# Patient Record
Sex: Female | Born: 1955
Health system: Southern US, Community
[De-identification: ages and names within clinical notes are randomized; demographics above are authoritative.]

## PROBLEM LIST (undated history)

## (undated) DIAGNOSIS — E669 Obesity, unspecified: Secondary | ICD-10-CM

## (undated) DIAGNOSIS — N952 Postmenopausal atrophic vaginitis: Secondary | ICD-10-CM

## (undated) DIAGNOSIS — D509 Iron deficiency anemia, unspecified: Secondary | ICD-10-CM

## (undated) DIAGNOSIS — I10 Essential (primary) hypertension: Secondary | ICD-10-CM

## (undated) DIAGNOSIS — K589 Irritable bowel syndrome without diarrhea: Secondary | ICD-10-CM

## (undated) DIAGNOSIS — G4733 Obstructive sleep apnea (adult) (pediatric): Secondary | ICD-10-CM

## (undated) HISTORY — DX: Irritable bowel syndrome, unspecified: K58.9

## (undated) HISTORY — PX: FRACTURE SURGERY: SHX138

## (undated) HISTORY — DX: Iron deficiency anemia, unspecified: D50.9

## (undated) HISTORY — DX: Postmenopausal atrophic vaginitis: N95.2

## (undated) HISTORY — DX: Obstructive sleep apnea (adult) (pediatric): G47.33

## (undated) HISTORY — PX: LAPAROSCOPY: SHX197

## (undated) HISTORY — DX: Obesity, unspecified: E66.9

---

## 2000-06-26 ENCOUNTER — Encounter: Admission: RE | Admit: 2000-06-26 | Discharge: 2000-06-26 | Payer: Self-pay | Admitting: Family Medicine

## 2000-06-26 ENCOUNTER — Encounter: Payer: Self-pay | Admitting: Family Medicine

## 2001-07-12 ENCOUNTER — Encounter: Admission: RE | Admit: 2001-07-12 | Discharge: 2001-07-12 | Payer: Self-pay | Admitting: Family Medicine

## 2001-07-12 ENCOUNTER — Encounter: Payer: Self-pay | Admitting: Family Medicine

## 2001-07-16 ENCOUNTER — Other Ambulatory Visit: Admission: RE | Admit: 2001-07-16 | Discharge: 2001-07-16 | Payer: Self-pay | Admitting: Family Medicine

## 2002-07-22 ENCOUNTER — Other Ambulatory Visit: Admission: RE | Admit: 2002-07-22 | Discharge: 2002-07-22 | Payer: Self-pay | Admitting: Family Medicine

## 2002-08-17 ENCOUNTER — Encounter: Payer: Self-pay | Admitting: Family Medicine

## 2002-08-17 ENCOUNTER — Encounter: Admission: RE | Admit: 2002-08-17 | Discharge: 2002-08-17 | Payer: Self-pay | Admitting: Family Medicine

## 2002-08-24 ENCOUNTER — Encounter: Payer: Self-pay | Admitting: Family Medicine

## 2002-08-24 ENCOUNTER — Encounter: Admission: RE | Admit: 2002-08-24 | Discharge: 2002-08-24 | Payer: Self-pay | Admitting: Family Medicine

## 2003-01-06 ENCOUNTER — Encounter: Admission: RE | Admit: 2003-01-06 | Discharge: 2003-01-06 | Payer: Self-pay | Admitting: Family Medicine

## 2003-01-06 ENCOUNTER — Encounter: Payer: Self-pay | Admitting: Family Medicine

## 2003-08-17 ENCOUNTER — Other Ambulatory Visit: Admission: RE | Admit: 2003-08-17 | Discharge: 2003-08-17 | Payer: Self-pay | Admitting: Family Medicine

## 2004-08-28 ENCOUNTER — Other Ambulatory Visit: Admission: RE | Admit: 2004-08-28 | Discharge: 2004-08-28 | Payer: Self-pay | Admitting: Family Medicine

## 2005-03-04 ENCOUNTER — Encounter: Admission: RE | Admit: 2005-03-04 | Discharge: 2005-03-04 | Payer: Self-pay | Admitting: Family Medicine

## 2005-09-04 ENCOUNTER — Other Ambulatory Visit: Admission: RE | Admit: 2005-09-04 | Discharge: 2005-09-04 | Payer: Self-pay | Admitting: Family Medicine

## 2006-09-07 ENCOUNTER — Other Ambulatory Visit: Admission: RE | Admit: 2006-09-07 | Discharge: 2006-09-07 | Payer: Self-pay | Admitting: Family Medicine

## 2007-06-10 ENCOUNTER — Encounter: Admission: RE | Admit: 2007-06-10 | Discharge: 2007-06-10 | Payer: Self-pay | Admitting: Family Medicine

## 2007-06-18 ENCOUNTER — Encounter: Admission: RE | Admit: 2007-06-18 | Discharge: 2007-06-18 | Payer: Self-pay | Admitting: Family Medicine

## 2007-10-08 ENCOUNTER — Encounter: Admission: RE | Admit: 2007-10-08 | Discharge: 2007-10-08 | Payer: Self-pay | Admitting: Family Medicine

## 2007-10-18 ENCOUNTER — Other Ambulatory Visit: Admission: RE | Admit: 2007-10-18 | Discharge: 2007-10-18 | Payer: Self-pay | Admitting: Obstetrics and Gynecology

## 2007-12-30 ENCOUNTER — Ambulatory Visit (HOSPITAL_COMMUNITY): Admission: RE | Admit: 2007-12-30 | Discharge: 2007-12-30 | Payer: Self-pay | Admitting: Obstetrics and Gynecology

## 2007-12-30 ENCOUNTER — Encounter (INDEPENDENT_AMBULATORY_CARE_PROVIDER_SITE_OTHER): Payer: Self-pay | Admitting: Obstetrics and Gynecology

## 2008-07-23 ENCOUNTER — Inpatient Hospital Stay: Payer: Self-pay | Admitting: Specialist

## 2008-08-01 ENCOUNTER — Other Ambulatory Visit: Admission: RE | Admit: 2008-08-01 | Discharge: 2008-08-01 | Payer: Self-pay | Admitting: Family Medicine

## 2009-08-23 ENCOUNTER — Other Ambulatory Visit: Admission: RE | Admit: 2009-08-23 | Discharge: 2009-08-23 | Payer: Self-pay | Admitting: Family Medicine

## 2009-09-05 ENCOUNTER — Encounter: Admission: RE | Admit: 2009-09-05 | Discharge: 2009-09-05 | Payer: Self-pay | Admitting: Family Medicine

## 2009-09-10 ENCOUNTER — Encounter: Admission: RE | Admit: 2009-09-10 | Discharge: 2009-09-10 | Payer: Self-pay | Admitting: Family Medicine

## 2010-08-06 NOTE — H&P (Signed)
Dana [name], Dana [name]                ACCOUNT NO.:  [id number]   MEDICAL RECORD NO.:  [id number]          PATIENT TYPE:  AMB   LOCATION:  SDC                           FACILITY:  [facility]   PHYSICIAN:  Charles A. Delcambre, MDDATE OF BIRTH:  22-Feb-1956   DATE OF ADMISSION:  DATE OF DISCHARGE:                              HISTORY & PHYSICAL   This patient to be admitted to undergo hysteroscopy D&C for  postmenopausal bleeding with a posterior endometrial mass noted on  hysterosalpingogram on October 30, 2007, at 12 noon.  She gives informed  consent, accepts risks of infection, bleeding, uterine perforation,  bowel and bladder damage, blood product risk including hepatitis and HIV  exposure, fluid overload.  All questions were answered.  She gives  informed consent.   PAST MEDICAL HISTORY:  Hypertension.   SURGICAL HISTORY:  Laparoscopy for endometriosis in 1992.   MEDICATIONS:  Lisinopril/hydrochlorothiazide 1/30 once a day.   ALLERGIES:  No known drug allergies.   SOCIAL HISTORY:  Denies tobacco, ethanol, or drug use or STD exposure in  the past.  One partner, monogamous, married.   FAMILY HISTORY:  Lung cancer in her mother, breast cancer in her mother,  not specified.   REVIEW OF SYSTEMS:  She sees Dr. Arvilla Market for occasional numbness in  arms, headaches, shortness of breath, chest pain; however, all other  reviews of systems is negative.  She occasionally has hot flashes at  night.   PHYSICAL EXAMINATION:  GENERAL:  Alert and oriented x3.  VITAL SIGNS:  Blood pressure 118/70, pulse 68, respirations 16,  afebrile.  HEART:  Regular rate and rhythm.  LUNGS:  Clear bilaterally.  ABDOMEN:  Soft, flat, nontender.  No masses.  PELVIC:  Normal external female genitalia.  Bartholin and urethral  glands within normal limits.  Bulb without discharge or lesions.   Endometrial biopsy on November 04, 2007, was negative.  Pap October 18, 2007,  was negative.  Bimanual examination, uterus  nonenlarged.  Adnexa  nontender without masses.  Ovaries nonpalpable bilaterally.   ASSESSMENT:  1. Postmenopausal bleeding, 627.1.  2. Endometrial polyp, 621.0.   PLAN:  Hysteroscopy D&C as noted above.  Preop CBC.  We will go ahead  and do a serum pregnancy and that she is about 1 year out from  menopause, n.p.o. post midnight.  All questions were answered.  We will  proceed as outlined.      Charles A. Sydnee Cabal, MD  Electronically Signed     CAD/MEDQ  D:  12/22/2007  T:  12/23/2007  Job:  [id number]

## 2010-08-06 NOTE — Op Note (Signed)
[name], [name]                ACCOUNT NO.:  [id number]   MEDICAL RECORD NO.:  [id number]          PATIENT TYPE:  AMB   LOCATION:  SDC                           [facility]ACILITY:  [facility]   PHYSICIAN:  Charles A. Delcambre, MDDATE OF BIRTH:  02/10/1956   DATE OF PROCEDURE:  12/30/2007  DATE OF DISCHARGE:                               OPERATIVE REPORT   PREOPERATIVE DIAGNOSES:  1. Postmenopausal bleeding  2. Endometrial mass.   POSTOPERATIVE DIAGNOSES:  1. Postmenopausal bleeding  2. Endometrial mass.   PROCEDURES:  1. Hysteroscopy.  2. Dilation and curettage.  3. Polypectomy versus fibroid resection.  4. Paracervical block.   SURGEON:  Charles A. Delcambre, MD   ASSISTANT:  None.   COMPLICATIONS:  None.   ESTIMATED BLOOD LOSS:  Less than 10 mL.   SORBITOL LOSS:  Less than 50 mL.   FINDINGS:  Anterior mass looking like a sessile polyp, 1-2 cm, but felt  more like a fibroid with curettage.   SPECIMEN:  Endometrial curettings with polyp/fibroid.   ANESTHESIA:  Monitored anesthesia care with IV sedation.  Instrument,  sponge, and needle count correct x2.   DESCRIPTION OF PROCEDURE:  The patient was taken to the operating room  and placed in supine position.  Anesthesia was given.  She was placed in  dorsal lithotomy position.  Sterile prep and drape was undertaken.  Speculum was placed in vagina.  Weighted speculum was placed in the  vagina and the anterior lip cervix was grasped with a single-tooth  tenaculum.  Paracervical block with 20 mL total 0.25% plain Marcaine was  injected at 4 and 8 o'clock.  There was no evidence of intravascular  location of injection.  Sound was to 8 cm.  No evidence of perforation.  Dilation with Hanks dilators were used to dilate up enough to pass 5-mm  scope.  Scope was placed.  Findings were noted above.  Using a curette,  the anterior lesion was curetted off and visualization after curettage  did yield clear uterus with the entire mass  resected.  With curettage  pieces of the mass and any endometrium were sent  to pathology.  Sorbitol loss was less than 50 mL.  There was no evidence  of perforation.  Tenaculum was removed.  Hemostasis was excellent.  The  patient was awakened and taken to recovery with physician in attendance  having tolerated the procedure well.      Charles A. Sydnee Cabal, MD  Electronically Signed     CAD/MEDQ  D:  12/30/2007  T:  12/31/2007  Job:  [id number]

## 2010-08-23 ENCOUNTER — Other Ambulatory Visit: Payer: Self-pay | Admitting: Family Medicine

## 2010-08-23 DIAGNOSIS — Z1231 Encounter for screening mammogram for malignant neoplasm of breast: Secondary | ICD-10-CM

## 2010-09-12 ENCOUNTER — Ambulatory Visit
Admission: RE | Admit: 2010-09-12 | Discharge: 2010-09-12 | Disposition: A | Discharge status: Home / Self Care | Payer: BC Managed Care – PPO | Source: Ambulatory Visit | Attending: Family Medicine | Admitting: Family Medicine

## 2010-09-12 DIAGNOSIS — Z1231 Encounter for screening mammogram for malignant neoplasm of breast: Secondary | ICD-10-CM

## 2010-12-24 LAB — HCG, SERUM, QUALITATIVE: Preg, Serum: NEGATIVE

## 2010-12-24 LAB — CBC
HCT: 39.3
Platelets: 176
RBC: 4.59
RDW: 13.5

## 2010-12-24 LAB — BASIC METABOLIC PANEL: BUN: 14

## 2011-05-12 ENCOUNTER — Ambulatory Visit
Admission: RE | Admit: 2011-05-12 | Discharge: 2011-05-12 | Disposition: A | Discharge status: Home / Self Care | Payer: BC Managed Care – PPO | Source: Ambulatory Visit | Attending: Family Medicine | Admitting: Family Medicine

## 2011-05-12 ENCOUNTER — Other Ambulatory Visit: Payer: Self-pay | Admitting: Family Medicine

## 2011-05-12 DIAGNOSIS — R52 Pain, unspecified: Secondary | ICD-10-CM

## 2011-05-12 DIAGNOSIS — R200 Anesthesia of skin: Secondary | ICD-10-CM

## 2011-06-02 ENCOUNTER — Other Ambulatory Visit: Payer: Self-pay | Admitting: Neurosurgery

## 2011-06-02 DIAGNOSIS — M47812 Spondylosis without myelopathy or radiculopathy, cervical region: Secondary | ICD-10-CM

## 2011-06-03 ENCOUNTER — Other Ambulatory Visit: Payer: BC Managed Care – PPO

## 2011-07-31 ENCOUNTER — Emergency Department (HOSPITAL_COMMUNITY)
Admission: EM | Admit: 2011-07-31 | Discharge: 2011-07-31 | Disposition: A | Discharge status: Home / Self Care | Payer: BC Managed Care – PPO | Attending: Emergency Medicine | Admitting: Emergency Medicine

## 2011-07-31 ENCOUNTER — Encounter (HOSPITAL_COMMUNITY): Payer: Self-pay

## 2011-07-31 ENCOUNTER — Emergency Department (HOSPITAL_COMMUNITY): Payer: BC Managed Care – PPO

## 2011-07-31 DIAGNOSIS — R079 Chest pain, unspecified: Secondary | ICD-10-CM | POA: Insufficient documentation

## 2011-07-31 DIAGNOSIS — I10 Essential (primary) hypertension: Secondary | ICD-10-CM | POA: Insufficient documentation

## 2011-07-31 HISTORY — DX: Essential (primary) hypertension: I10

## 2011-07-31 LAB — CBC
MCH: 28.7 pg (ref 26.0–34.0)
MCV: 83.1 fL (ref 78.0–100.0)
RBC: 4.43 MIL/uL (ref 3.87–5.11)
RDW: 12.6 % (ref 11.5–15.5)

## 2011-07-31 LAB — POCT I-STAT TROPONIN I
Troponin i, poc: 0 ng/mL (ref 0.00–0.08)
Troponin i, poc: 0 ng/mL (ref 0.00–0.08)

## 2011-07-31 LAB — D-DIMER, QUANTITATIVE: D-Dimer, Quant: <0.22 ug/mL-FEU (ref 0.00–0.48)

## 2011-07-31 LAB — COMPREHENSIVE METABOLIC PANEL
CO2: 25 mEq/L (ref 19–32)
Calcium: 9.8 mg/dL (ref 8.4–10.5)
Creatinine, Ser: 0.62 mg/dL (ref 0.50–1.10)
Sodium: 139 mEq/L (ref 135–145)
Total Bilirubin: 0.5 mg/dL (ref 0.3–1.2)

## 2011-07-31 LAB — DIFFERENTIAL
Basophils Absolute: 0 10*3/uL (ref 0.0–0.1)
Basophils Relative: 0 % (ref 0–1)
Eosinophils Absolute: 0.1 10*3/uL (ref 0.0–0.7)
Lymphs Abs: 1.3 10*3/uL (ref 0.7–4.0)
Monocytes Relative: 8 % (ref 3–12)

## 2011-07-31 NOTE — Discharge Instructions (Signed)
Chest Pain (Nonspecific) It is often hard to give a specific diagnosis for the cause of chest pain. There is always a chance that your pain could be related to something serious, such as a heart attack or a blood clot in the lungs. You need to follow up with your caregiver for further evaluation. CAUSES   Heartburn.   Pneumonia or bronchitis.   Anxiety or stress.   Inflammation around your heart (pericarditis) or lung (pleuritis or pleurisy).   A blood clot in the lung.   A collapsed lung (pneumothorax). It can develop suddenly on its own (spontaneous pneumothorax) or from injury (trauma) to the chest.   Shingles infection (herpes zoster virus).  The chest wall is composed of bones, muscles, and cartilage. Any of these can be the source of the pain.  The bones can be bruised by injury.   The muscles or cartilage can be strained by coughing or overwork.   The cartilage can be affected by inflammation and become sore (costochondritis).  DIAGNOSIS  Lab tests or other studies, such as X-rays, electrocardiography, stress testing, or cardiac imaging, may be needed to find the cause of your pain.  TREATMENT   Treatment depends on what may be causing your chest pain. Treatment may include:   Acid blockers for heartburn.   Anti-inflammatory medicine.   Pain medicine for inflammatory conditions.   Antibiotics if an infection is present.   You may be advised to change lifestyle habits. This includes stopping smoking and avoiding alcohol, caffeine, and chocolate.   You may be advised to keep your head raised (elevated) when sleeping. This reduces the chance of acid going backward from your stomach into your esophagus.   Most of the time, nonspecific chest pain will improve within 2 to 3 days with rest and mild pain medicine.  HOME CARE INSTRUCTIONS   If antibiotics were prescribed, take your antibiotics as directed. Finish them even if you start to feel better.   For the next few  days, avoid physical activities that bring on chest pain. Continue physical activities as directed.   Do not smoke.   Avoid drinking alcohol.   Only take over-the-counter or prescription medicine for pain, discomfort, or fever as directed by your caregiver.   Follow your caregiver's suggestions for further testing if your chest pain does not go away.   Keep any follow-up appointments you made. If you do not go to an appointment, you could develop lasting (chronic) problems with pain. If there is any problem keeping an appointment, you must call to reschedule.  SEEK MEDICAL CARE IF:   You think you are having problems from the medicine you are taking. Read your medicine instructions carefully.   Your chest pain does not go away, even after treatment.   You develop a rash with blisters on your chest.  SEEK IMMEDIATE MEDICAL CARE IF:   You have increased chest pain or pain that spreads to your arm, neck, jaw, back, or abdomen.   You develop shortness of breath, an increasing cough, or you are coughing up blood.   You have severe back or abdominal pain, feel nauseous, or vomit.   You develop severe weakness, fainting, or chills.   You have a fever.  THIS IS AN EMERGENCY. Do not wait to see if the pain will go away. Get medical help at once. Call your local emergency services (911 in [place] Do not drive yourself to the hospital. MAKE SURE YOU:   Understand these instructions.     Will watch your condition.   Will get help right away if you are not doing well or get worse.  Document Released: 12/18/2004 Document Revised: 02/27/2011 Document Reviewed: 10/14/2007 [employer] Patient Information 2012 [employer]. 

## 2011-07-31 NOTE — ED Provider Notes (Signed)
History     CSN: [id number]  Arrival date & time 07/31/11  1245   First MD Initiated Contact with Patient 07/31/11 1255      Chief Complaint  Patient presents with  . Chest Pain    (Consider location/radiation/quality/duration/timing/severity/associated sxs/prior treatment) Patient is a 56 y.o. female presenting with chest pain. The history is provided by the patient.  Chest Pain Episode onset: Sure that started on Monday and went away and then returned today. Duration of episode(s) is 1 hour. Chest pain occurs constantly. The chest pain is improving. The pain is associated with coughing and breathing. At its most intense, the pain is at 7/10. The pain is currently at 4/10. The severity of the pain is moderate. The quality of the pain is described as sharp and pleuritic. The pain does not radiate. Chest pain is worsened by deep breathing. Primary symptoms include cough. Pertinent negatives for primary symptoms include no fever, no shortness of breath, no wheezing, no palpitations, no nausea and no vomiting. She tried nothing for the symptoms. Risk factors include post-menopausal.  Her past medical history is significant for hypertension.  Pertinent negatives for past medical history include no CAD, no diabetes, no hyperlipidemia and no MI.     Past Medical History  Diagnosis Date  . Hypertension     Past Surgical History  Procedure Date  . Fracture surgery   . Cesarean section   . Laparoscopy     No family history on file.  History  Substance Use Topics  . Smoking status: Never Smoker   . Smokeless tobacco: Not on file  . Alcohol Use: No    OB History    Grav Para Term Preterm Abortions TAB SAB Ect Mult Living                  Review of Systems  Constitutional: Negative for fever.  Respiratory: Positive for cough. Negative for shortness of breath and wheezing.   Cardiovascular: Positive for chest pain. Negative for palpitations.  Gastrointestinal: Negative for  nausea and vomiting.  All other systems reviewed and are negative.    Allergies  Codeine  Home Medications   Current Outpatient Rx  Name Route Sig Dispense Refill  . TYLENOL PO Oral Take 2 tablets by mouth 2 (two) times daily as needed. For cold symptoms.    Marland Kitchen GABAPENTIN 100 MG PO CAPS Oral Take 200-300 mg by mouth at bedtime.    . MUCINEX PO Oral Take 20 mLs by mouth daily as needed. For cold symptoms.    Marland Kitchen LISINOPRIL-HYDROCHLOROTHIAZIDE 10-12.5 MG PO TABS Oral Take 1 tablet by mouth daily.      BP 133/78  Pulse 74  Temp(Src) 98.1 F (36.7 C) (Oral)  Resp 17  Ht 5\' 4"  (1.626 m)  Wt 174 lb (78.926 kg)  BMI 29.87 kg/m2  SpO2 100%  Physical Exam  Nursing note and vitals reviewed. Constitutional: She is oriented to person, place, and time. She appears well-developed and well-nourished. No distress.  HENT:  Head: Normocephalic and atraumatic.  Mouth/Throat: Oropharynx is clear and moist.  Eyes: EOM are normal. Pupils are equal, round, and reactive to light.  Cardiovascular: Normal rate, regular rhythm, normal heart sounds and intact distal pulses.  Exam reveals no friction rub.   No murmur heard. Pulmonary/Chest: Effort normal and breath sounds normal. She has no wheezes. She has no rales. She exhibits no tenderness.  Abdominal: Soft. Bowel sounds are normal. She exhibits no distension. There is no tenderness.  There is no rebound and no guarding.  Musculoskeletal: Normal range of motion. She exhibits no tenderness.       No edema  Neurological: She is alert and oriented to person, place, and time. No cranial nerve deficit.  Skin: Skin is warm and dry. No rash noted.  Psychiatric: She has a normal mood and affect. Her behavior is normal.    ED Course  Procedures (including critical care time)  Labs Reviewed  COMPREHENSIVE METABOLIC PANEL - Abnormal; Notable for the following:    Potassium 3.4 (*)    Glucose, Bld 100 (*)    All other components within normal limits    CBC  DIFFERENTIAL  D-DIMER, QUANTITATIVE  POCT I-STAT TROPONIN I   Dg Chest 2 View  07/31/2011  *RADIOLOGY REPORT*  Clinical Data: chest pain, shortness of breath.  CHEST - 2 VIEW  Comparison: 10/08/2007  Findings: Heart and mediastinal contours are within normal limits. No focal opacities or effusions.  No acute bony abnormality. Intramedullary rod in the left humerus.  IMPRESSION: No active cardiopulmonary disease.  Original Report Authenticated By: Cyndie Chime, M.D.     Date: 07/31/2011  Rate: 76  Rhythm: normal sinus rhythm  QRS Axis: normal  Intervals: normal  ST/T Wave abnormalities: normal  Conduction Disutrbances: none  Narrative Interpretation: unremarkable     No diagnosis found.    MDM   Pt with atypical story for CP.  TIMI 0 and risk factors are only HTN.  Low risk well's criteria.  EKG wnl.  CXR, CBC, BMP, CE (0,32min), d-dimer pending.  She's had a nonproductive cough for the last one week but denies any fever shortness of breath. She has not had any nausea or vomiting. The pain is not related to eating. No recent travel or swelling in the legs.  2:52 PM All labs wnl.  CXR wnl.  Repeat troponin wnl.  Pt will be d/ced home to f/u PCP.       Gwyneth Sprout, MD 07/31/11 1453

## 2011-07-31 NOTE — ED Notes (Signed)
Pt was brought in by EMS with c/o chest pain onset at 1100 today worse with deep breathing. Pt was given ASA 325 mg with NTG SL x4. Ptis A/A/Ox4, skin is warm and dry, respiration is even and unlabored.

## 2011-07-31 NOTE — ED Notes (Signed)
Pt placed back on monitor, continuous pulse oximetry and blood pressure cuff; family at bedside 

## 2011-07-31 NOTE — ED Notes (Signed)
Pt placed back on monitor, continuous pulse ox, blood pressure cuff and EKG

## 2011-07-31 NOTE — ED Notes (Signed)
Pt was attached to the cardiac monitor. Pt is NAD.

## 2011-11-11 ENCOUNTER — Other Ambulatory Visit: Payer: Self-pay | Admitting: Family Medicine

## 2011-11-11 DIAGNOSIS — Z1231 Encounter for screening mammogram for malignant neoplasm of breast: Secondary | ICD-10-CM

## 2011-12-04 ENCOUNTER — Ambulatory Visit
Admission: RE | Admit: 2011-12-04 | Discharge: 2011-12-04 | Disposition: A | Discharge status: Home / Self Care | Payer: BC Managed Care – PPO | Source: Ambulatory Visit | Attending: Family Medicine | Admitting: Family Medicine

## 2011-12-04 DIAGNOSIS — Z1231 Encounter for screening mammogram for malignant neoplasm of breast: Secondary | ICD-10-CM

## 2012-09-01 ENCOUNTER — Other Ambulatory Visit: Payer: Self-pay | Admitting: Family Medicine

## 2012-09-01 ENCOUNTER — Other Ambulatory Visit (HOSPITAL_COMMUNITY)
Admission: RE | Admit: 2012-09-01 | Discharge: 2012-09-01 | Disposition: A | Discharge status: Home / Self Care | Payer: BC Managed Care – PPO | Source: Ambulatory Visit | Attending: Family Medicine | Admitting: Family Medicine

## 2012-09-01 DIAGNOSIS — Z1151 Encounter for screening for human papillomavirus (HPV): Secondary | ICD-10-CM | POA: Insufficient documentation

## 2012-09-01 DIAGNOSIS — Z124 Encounter for screening for malignant neoplasm of cervix: Secondary | ICD-10-CM | POA: Insufficient documentation

## 2013-01-06 ENCOUNTER — Ambulatory Visit: Payer: BC Managed Care – PPO | Admitting: Cardiology

## 2013-01-22 ENCOUNTER — Encounter: Payer: Self-pay | Admitting: *Deleted

## 2013-01-22 ENCOUNTER — Encounter: Payer: Self-pay | Admitting: Cardiology

## 2013-01-22 DIAGNOSIS — E669 Obesity, unspecified: Secondary | ICD-10-CM | POA: Insufficient documentation

## 2013-01-22 DIAGNOSIS — K589 Irritable bowel syndrome without diarrhea: Secondary | ICD-10-CM | POA: Insufficient documentation

## 2013-01-22 DIAGNOSIS — G4733 Obstructive sleep apnea (adult) (pediatric): Secondary | ICD-10-CM | POA: Insufficient documentation

## 2013-01-22 DIAGNOSIS — N952 Postmenopausal atrophic vaginitis: Secondary | ICD-10-CM | POA: Insufficient documentation

## 2013-01-22 DIAGNOSIS — D509 Iron deficiency anemia, unspecified: Secondary | ICD-10-CM | POA: Insufficient documentation

## 2013-01-24 ENCOUNTER — Encounter: Payer: Self-pay | Admitting: Cardiology

## 2013-01-24 ENCOUNTER — Ambulatory Visit (INDEPENDENT_AMBULATORY_CARE_PROVIDER_SITE_OTHER): Payer: BC Managed Care – PPO | Admitting: Cardiology

## 2013-01-24 VITALS — BP 128/71 | HR 77 | Ht 64.0 in | Wt 174.0 lb

## 2013-01-24 DIAGNOSIS — I10 Essential (primary) hypertension: Secondary | ICD-10-CM | POA: Insufficient documentation

## 2013-01-24 DIAGNOSIS — E669 Obesity, unspecified: Secondary | ICD-10-CM

## 2013-01-24 DIAGNOSIS — G4733 Obstructive sleep apnea (adult) (pediatric): Secondary | ICD-10-CM

## 2013-01-24 NOTE — Progress Notes (Signed)
  [address] [place], [place]  [address] Phone: [phone] Fax:  [phone]  Date:  01/24/2013   ID:  Dana [name], DOB 12-Jul-1955, MRN [id number]  PCP:  No primary provider on file.  Sleep Medicine:  Armanda Magic, MD     History of Present Illness: Dana [name] is a 57 y.o. female with a history of OSA/obesity and HTN.  She is doing well and presents today for followup.  She tolerates her CPAP device well.  She tolerates her mask and feels with pressure is adequate.  She has no daytime sleepiness and feels rested in the am.  She no longer snores.  She continue to complain of occasional headaches and upper shoulder discomfort.   Wt Readings from Last 3 Encounters:  07/31/11 174 lb (78.926 kg)     Past Medical History  Diagnosis Date  . Postmenopausal atrophic vaginitis   . Iron deficiency anemia, unspecified   . IBS (irritable bowel syndrome)   . Obesity   . OSA (obstructive sleep apnea)     with AHI 10.79/hr with daytime sleepiness now on CPAP   . Hypertension     Current Outpatient Prescriptions  Medication Sig Dispense Refill  . Acetaminophen (TYLENOL PO) Take 2 tablets by mouth 2 (two) times daily as needed. For cold symptoms.      Marland Kitchen gabapentin (NEURONTIN) 100 MG capsule Take 200-300 mg by mouth at bedtime.      . GuaiFENesin (MUCINEX PO) Take 20 mLs by mouth daily as needed. For cold symptoms.      Marland Kitchen lisinopril-hydrochlorothiazide (PRINZIDE,ZESTORETIC) 10-12.5 MG per tablet Take 1 tablet by mouth daily.       No current facility-administered medications for this visit.    Allergies:    Allergies  Allergen Reactions  . Codeine Other (See Comments)    Upset stomach    Social History:  The patient  reports that she has never smoked. She does not have any smokeless tobacco history on file. She reports that she does not drink alcohol or use illicit drugs.   Family History:  The patient's family history includes Cancer in her mother; Hypertension in  her brother.   ROS:  Please see the history of present illness.      All other systems reviewed and negative.   PHYSICAL EXAM: VS:  There were no vitals taken for this visit. Well nourished, well developed, in no acute distress HEENT: normal Neck: no JVD Cardiac:  normal S1, S2; RRR; no murmur Lungs:  clear to auscultation bilaterally, no wheezing, rhonchi or rales Abd: soft, nontender, no hepatomegaly Ext: no edema Skin: warm and dry Neuro:  CNs 2-12 intact, no focal abnormalities noted       ASSESSMENT AND PLAN:  1. OSA on  CPAP therapy - her d/l today showed an AHI of 2.2/hr on 6cm H2O and 46% complianct in using more than 4 hours nightly.  - continue current CPAP settings   - I encouraged her to try to use her device more frequently which may help with her HA's 2. HTN - controlled  - continue Lisinopril HCT 3. Obesity 4. Headaches that persist despite CPAP  - I have encouraged her to followup with her PCP for further evaluation  Followup with me in 6 months Signed, Armanda Magic, MD 01/24/2013 2:57 PM

## 2013-01-24 NOTE — Patient Instructions (Signed)
Your physician wants you to follow-up in: 6 months with Dr. Turner. You will receive a reminder letter in the mail two months in advance. If you don't receive a letter, please call our office to schedule the follow-up appointment.  

## 2013-05-06 ENCOUNTER — Encounter: Payer: Self-pay | Admitting: *Deleted

## 2013-09-15 ENCOUNTER — Other Ambulatory Visit: Payer: Self-pay

## 2013-09-15 DIAGNOSIS — Z803 Family history of malignant neoplasm of breast: Secondary | ICD-10-CM

## 2013-09-15 DIAGNOSIS — Z1231 Encounter for screening mammogram for malignant neoplasm of breast: Secondary | ICD-10-CM

## 2013-09-19 ENCOUNTER — Ambulatory Visit
Admission: RE | Admit: 2013-09-19 | Discharge: 2013-09-19 | Disposition: A | Discharge status: Home / Self Care | Payer: BC Managed Care – PPO | Source: Ambulatory Visit

## 2013-09-19 DIAGNOSIS — Z803 Family history of malignant neoplasm of breast: Secondary | ICD-10-CM

## 2013-09-19 DIAGNOSIS — Z1231 Encounter for screening mammogram for malignant neoplasm of breast: Secondary | ICD-10-CM

## 2014-01-04 ENCOUNTER — Ambulatory Visit
Admission: RE | Admit: 2014-01-04 | Discharge: 2014-01-04 | Disposition: A | Discharge status: Home / Self Care | Payer: BC Managed Care – PPO | Source: Ambulatory Visit | Attending: Physician Assistant | Admitting: Physician Assistant

## 2014-01-04 ENCOUNTER — Encounter (HOSPITAL_COMMUNITY): Payer: Self-pay | Admitting: Emergency Medicine

## 2014-01-04 ENCOUNTER — Ambulatory Visit: Admit: 2014-01-04 | Payer: Self-pay | Admitting: General Surgery

## 2014-01-04 ENCOUNTER — Other Ambulatory Visit: Payer: Self-pay | Admitting: Physician Assistant

## 2014-01-04 ENCOUNTER — Emergency Department (HOSPITAL_COMMUNITY): Payer: BC Managed Care – PPO

## 2014-01-04 ENCOUNTER — Encounter (HOSPITAL_COMMUNITY)
Admission: EM | Disposition: A | Discharge status: Home / Self Care | Payer: Self-pay | Source: Home / Self Care | Attending: Emergency Medicine

## 2014-01-04 ENCOUNTER — Ambulatory Visit (HOSPITAL_COMMUNITY)
Admission: EM | Admit: 2014-01-04 | Discharge: 2014-01-05 | Disposition: A | Discharge status: Home / Self Care | Payer: BC Managed Care – PPO | Attending: General Surgery | Admitting: General Surgery

## 2014-01-04 ENCOUNTER — Emergency Department (HOSPITAL_COMMUNITY): Payer: BC Managed Care – PPO | Admitting: Anesthesiology

## 2014-01-04 ENCOUNTER — Encounter (HOSPITAL_COMMUNITY): Payer: BC Managed Care – PPO | Admitting: Anesthesiology

## 2014-01-04 DIAGNOSIS — R1031 Right lower quadrant pain: Secondary | ICD-10-CM

## 2014-01-04 DIAGNOSIS — K589 Irritable bowel syndrome without diarrhea: Secondary | ICD-10-CM | POA: Insufficient documentation

## 2014-01-04 DIAGNOSIS — Z885 Allergy status to narcotic agent status: Secondary | ICD-10-CM | POA: Insufficient documentation

## 2014-01-04 DIAGNOSIS — Z87891 Personal history of nicotine dependence: Secondary | ICD-10-CM | POA: Insufficient documentation

## 2014-01-04 DIAGNOSIS — K3580 Unspecified acute appendicitis: Secondary | ICD-10-CM | POA: Diagnosis present

## 2014-01-04 DIAGNOSIS — I10 Essential (primary) hypertension: Secondary | ICD-10-CM | POA: Insufficient documentation

## 2014-01-04 DIAGNOSIS — K353 Acute appendicitis with localized peritonitis, without perforation or gangrene: Secondary | ICD-10-CM

## 2014-01-04 DIAGNOSIS — G4733 Obstructive sleep apnea (adult) (pediatric): Secondary | ICD-10-CM | POA: Diagnosis not present

## 2014-01-04 DIAGNOSIS — D509 Iron deficiency anemia, unspecified: Secondary | ICD-10-CM | POA: Insufficient documentation

## 2014-01-04 DIAGNOSIS — E669 Obesity, unspecified: Secondary | ICD-10-CM | POA: Diagnosis not present

## 2014-01-04 HISTORY — PX: LAPAROSCOPIC APPENDECTOMY: SHX408

## 2014-01-04 LAB — CBC WITH DIFFERENTIAL/PLATELET
BASOS ABS: 0 10*3/uL (ref 0.0–0.1)
BASOS PCT: 0 % (ref 0–1)
Eosinophils Absolute: 0 10*3/uL (ref 0.0–0.7)
Eosinophils Relative: 0 % (ref 0–5)
HEMATOCRIT: 36.3 % (ref 36.0–46.0)
Hemoglobin: 12.7 g/dL (ref 12.0–15.0)
Lymphocytes Relative: 12 % (ref 12–46)
Lymphs Abs: 1.1 10*3/uL (ref 0.7–4.0)
MCH: 28.8 pg (ref 26.0–34.0)
MCHC: 35 g/dL (ref 30.0–36.0)
MCV: 82.3 fL (ref 78.0–100.0)
MONO ABS: 0.6 10*3/uL (ref 0.1–1.0)
Monocytes Relative: 6 % (ref 3–12)
NEUTROS ABS: 7.4 10*3/uL (ref 1.7–7.7)
NEUTROS PCT: 82 % — AB (ref 43–77)
PLATELETS: 151 10*3/uL (ref 150–400)
RBC: 4.41 MIL/uL (ref 3.87–5.11)
RDW: 12.4 % (ref 11.5–15.5)
WBC: 9 10*3/uL (ref 4.0–10.5)

## 2014-01-04 LAB — BASIC METABOLIC PANEL
ANION GAP: 15 (ref 5–15)
BUN: 13 mg/dL (ref 6–23)
CHLORIDE: 95 meq/L — AB (ref 96–112)
CO2: 25 mEq/L (ref 19–32)
CREATININE: 0.72 mg/dL (ref 0.50–1.10)
Calcium: 9.4 mg/dL (ref 8.4–10.5)
GFR calc non Af Amer: >90 mL/min (ref >90)
Glucose, Bld: 113 mg/dL — ABNORMAL HIGH (ref 70–99)
Potassium: 3.5 mEq/L — ABNORMAL LOW (ref 3.7–5.3)
SODIUM: 135 meq/L — AB (ref 137–147)

## 2014-01-04 LAB — SAMPLE TO BLOOD BANK

## 2014-01-04 IMAGING — CR DG CHEST 2V
2 series · 2 of 2 positions shown · non-contrast
Comparison: [DATE]

CLINICAL DATA: Preop for appendectomy

EXAM:
CHEST  2 VIEW

[w chest pa]
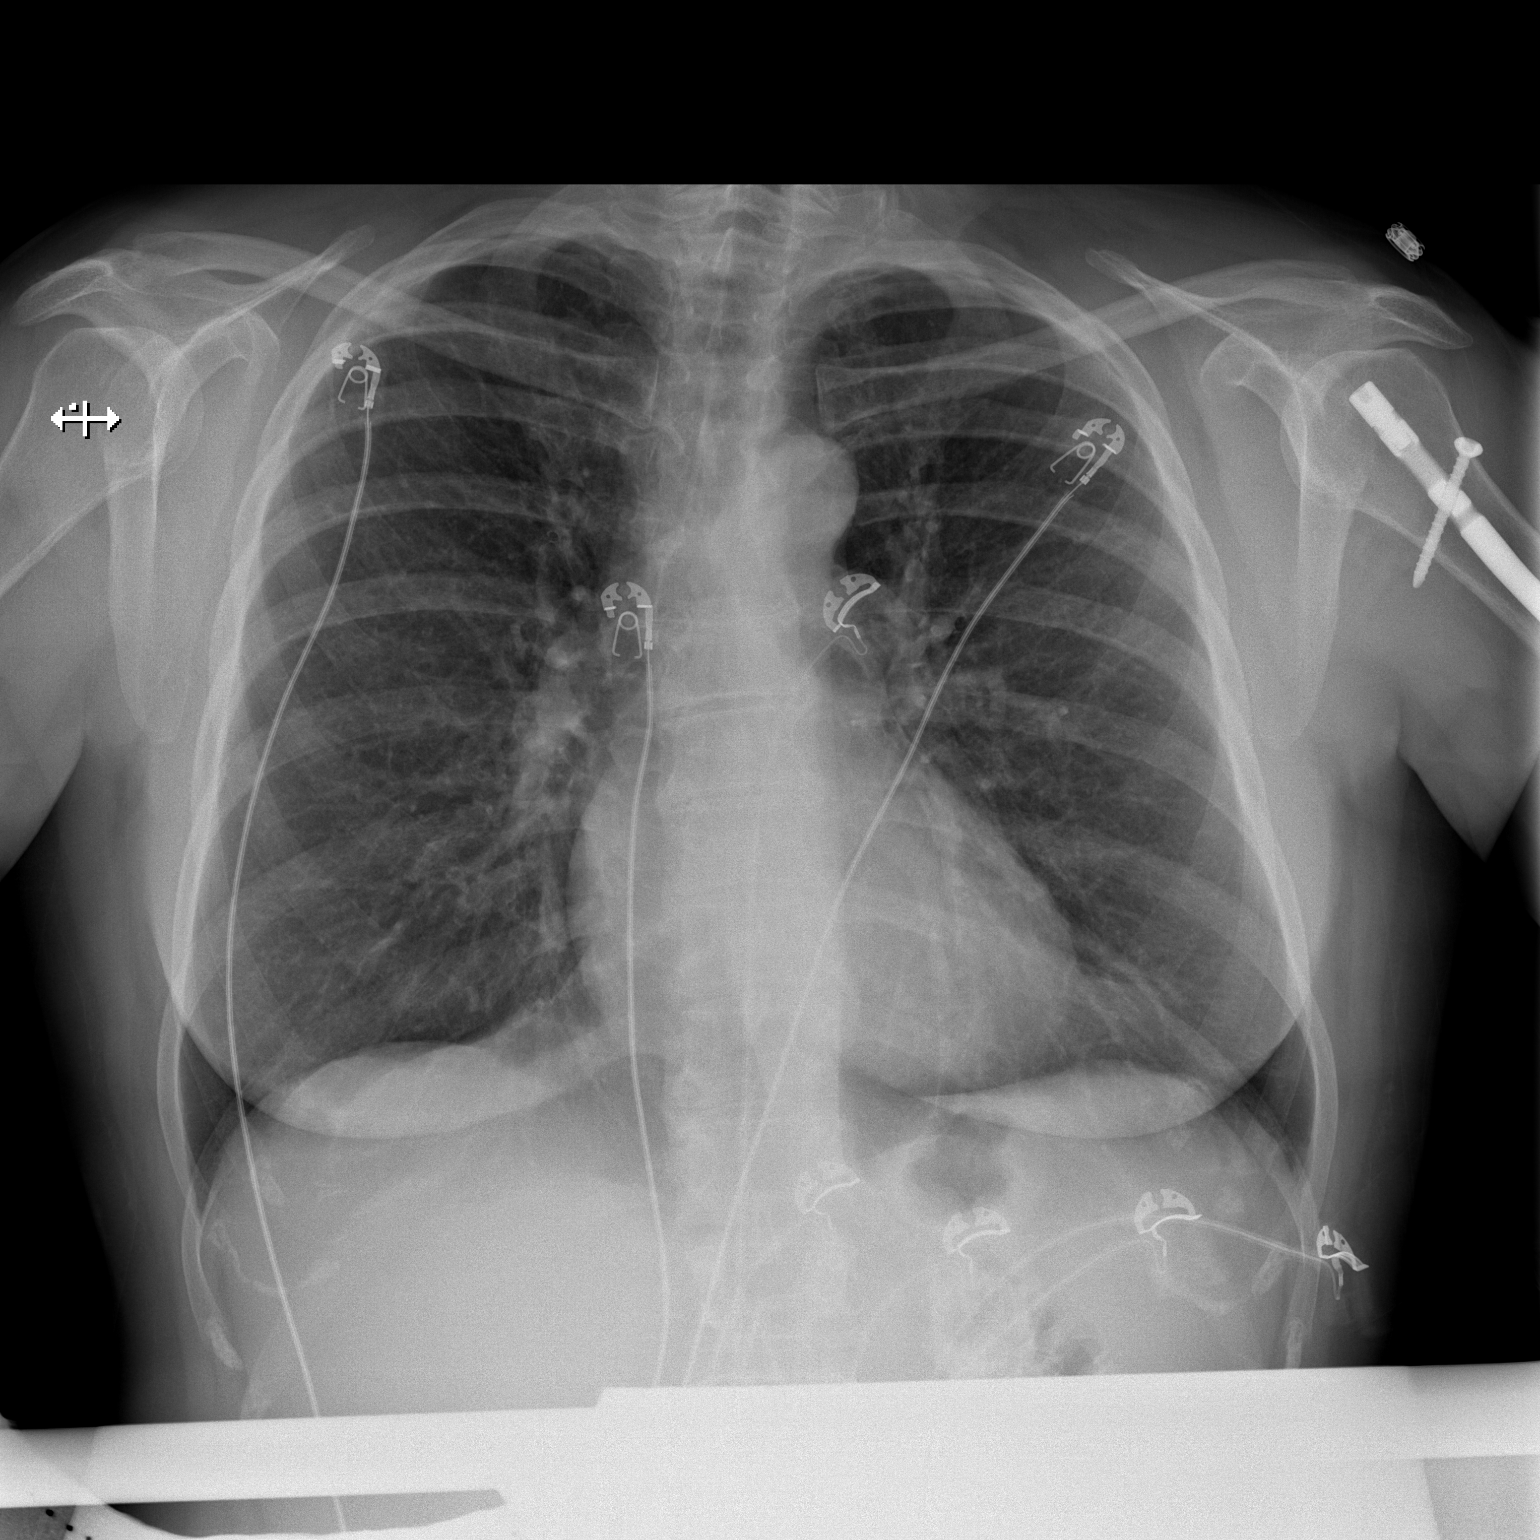

[w chest lat]
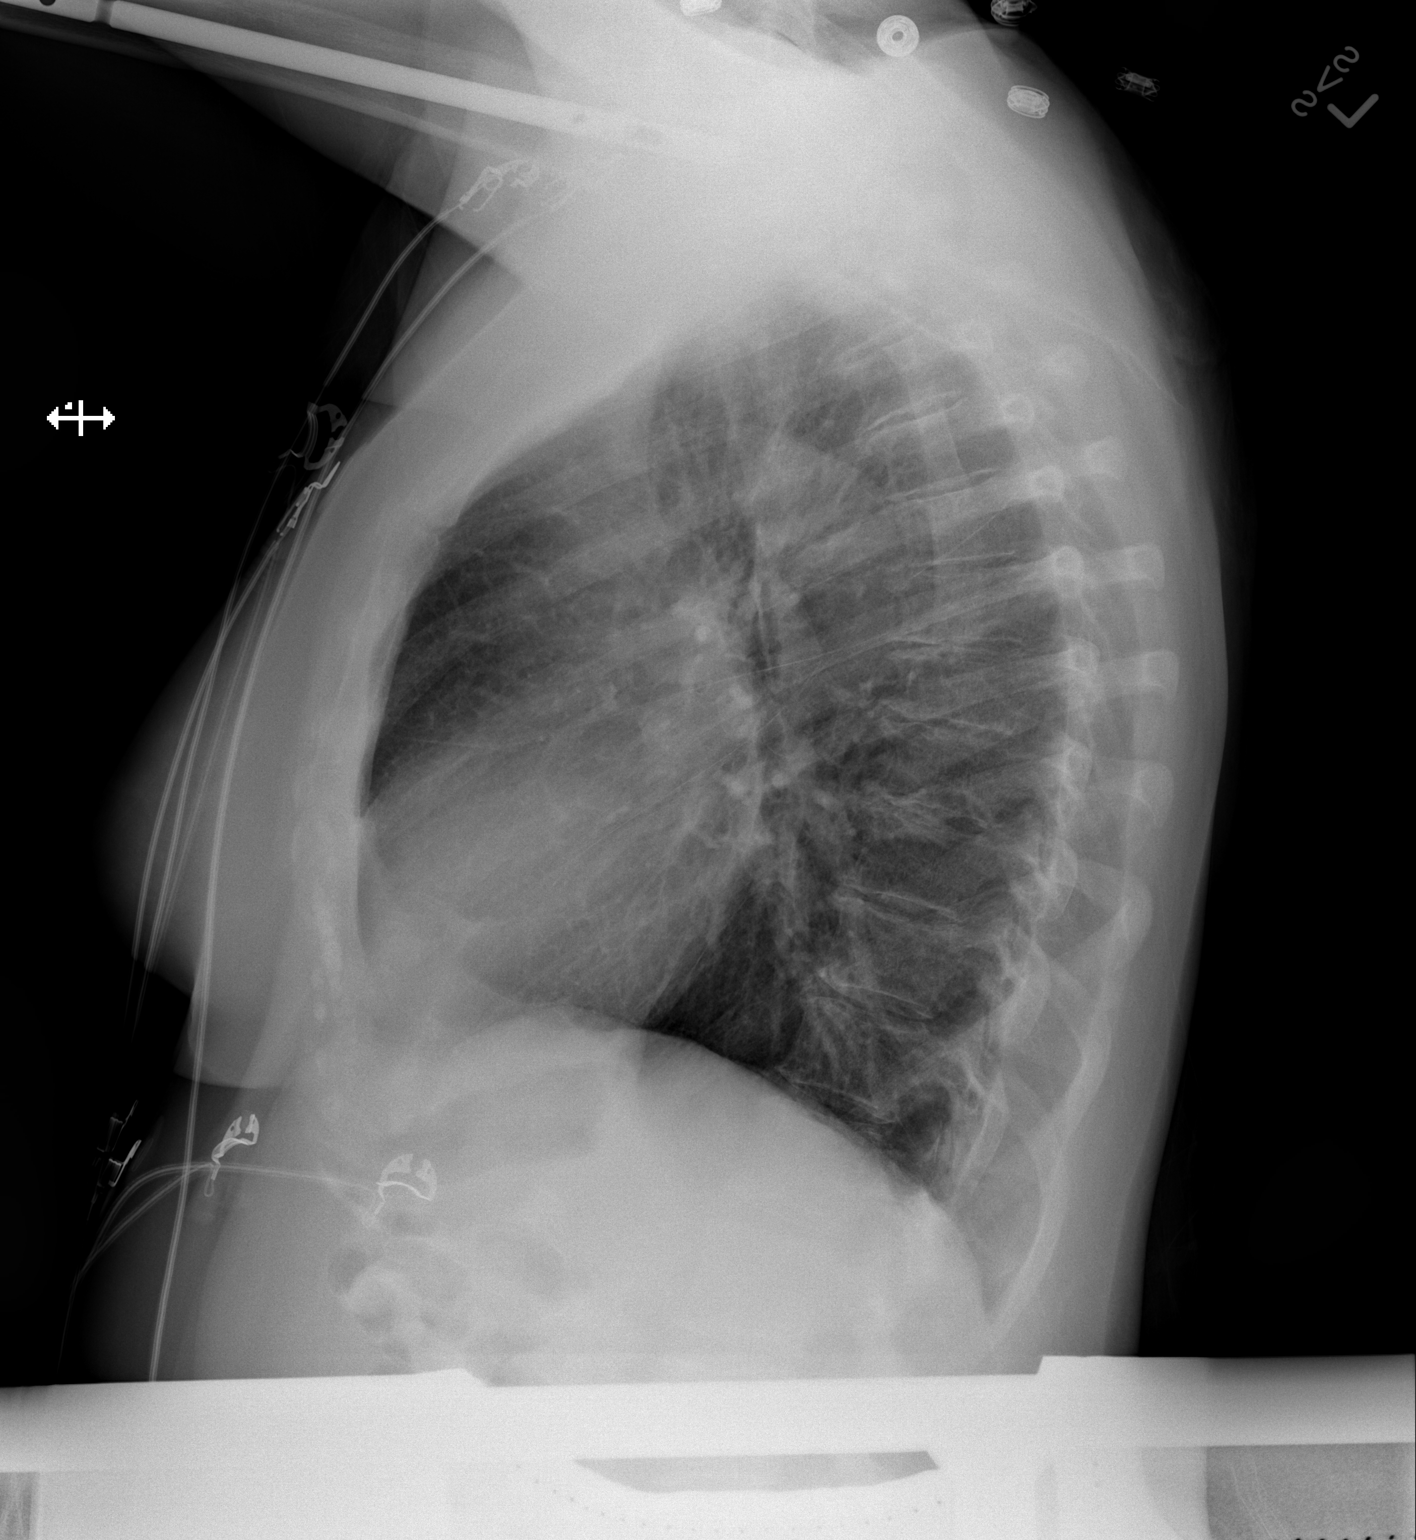

[2 of 2 positions shown; findings below may reference images not displayed]

FINDINGS: Cardiomediastinal silhouette is stable. No acute infiltrate or
pleural effusion. No pulmonary edema. Postsurgical changes are noted
left humerus. Stable degenerative changes thoracic spine.
IMPRESSION: No active cardiopulmonary disease.

## 2014-01-04 SURGERY — APPENDECTOMY, LAPAROSCOPIC
Anesthesia: General | Site: Abdomen

## 2014-01-04 MED ORDER — CISATRACURIUM BESYLATE 20 MG/10ML IV SOLN
INTRAVENOUS | Status: AC
  Filled 2014-01-04: qty 10

## 2014-01-04 MED ORDER — SODIUM CHLORIDE 0.9 % IV SOLN
INTRAVENOUS | Status: AC
  Filled 2014-01-04: qty 1

## 2014-01-04 MED ORDER — FENTANYL CITRATE 0.05 MG/ML IJ SOLN
INTRAMUSCULAR | Status: AC
  Filled 2014-01-04: qty 5

## 2014-01-04 MED ORDER — ACETAMINOPHEN 10 MG/ML IV SOLN
1000.0000 mg | Freq: Once | INTRAVENOUS | Status: AC
  Administered 2014-01-04: 1000 mg via INTRAVENOUS
  Filled 2014-01-04: qty 100

## 2014-01-04 MED ORDER — FENTANYL CITRATE 0.05 MG/ML IJ SOLN
INTRAMUSCULAR | Status: DC | PRN
  Administered 2014-01-04 (×5): 50 ug via INTRAVENOUS

## 2014-01-04 MED ORDER — GLYCOPYRROLATE 0.2 MG/ML IJ SOLN
INTRAMUSCULAR | Status: DC | PRN
  Administered 2014-01-04: .4 mg via INTRAVENOUS
  Administered 2014-01-04: .2 mg via INTRAVENOUS

## 2014-01-04 MED ORDER — GLYCOPYRROLATE 0.2 MG/ML IJ SOLN: INTRAMUSCULAR | Status: DC | PRN

## 2014-01-04 MED ORDER — OXYCODONE HCL 5 MG/5ML PO SOLN: 5.0000 mg | Freq: Once | ORAL | Status: DC | PRN

## 2014-01-04 MED ORDER — HYDROCHLOROTHIAZIDE 12.5 MG PO CAPS
12.5000 mg | ORAL_CAPSULE | Freq: Every day | ORAL | Status: DC
  Administered 2014-01-04: 12.5 mg via ORAL
  Filled 2014-01-04 (×2): qty 1

## 2014-01-04 MED ORDER — IOHEXOL 300 MG/ML  SOLN
100.0000 mL | Freq: Once | INTRAMUSCULAR | Status: AC | PRN
  Administered 2014-01-04: 100 mL via INTRAVENOUS

## 2014-01-04 MED ORDER — LACTATED RINGERS IR SOLN
Status: DC | PRN
  Administered 2014-01-04: 1

## 2014-01-04 MED ORDER — FENTANYL CITRATE 0.05 MG/ML IJ SOLN
50.0000 ug | INTRAMUSCULAR | Status: DC | PRN
  Administered 2014-01-05: 50 ug via INTRAVENOUS
  Filled 2014-01-04: qty 2

## 2014-01-04 MED ORDER — NEOSTIGMINE METHYLSULFATE 10 MG/10ML IV SOLN
INTRAVENOUS | Status: AC
  Filled 2014-01-04: qty 1

## 2014-01-04 MED ORDER — LACTATED RINGERS IV SOLN
INTRAVENOUS | Status: DC | PRN
  Administered 2014-01-04 (×2): via INTRAVENOUS

## 2014-01-04 MED ORDER — OXYCODONE HCL 5 MG PO TABS: 5.0000 mg | ORAL_TABLET | Freq: Once | ORAL | Status: DC | PRN

## 2014-01-04 MED ORDER — MEPERIDINE HCL 50 MG/ML IJ SOLN: 6.2500 mg | INTRAMUSCULAR | Status: DC | PRN

## 2014-01-04 MED ORDER — ONDANSETRON HCL 4 MG/2ML IJ SOLN
INTRAMUSCULAR | Status: AC
  Filled 2014-01-04: qty 2

## 2014-01-04 MED ORDER — CISATRACURIUM BESYLATE (PF) 10 MG/5ML IV SOLN
INTRAVENOUS | Status: DC | PRN
  Administered 2014-01-04: 6 mg via INTRAVENOUS

## 2014-01-04 MED ORDER — ONDANSETRON HCL 4 MG/2ML IJ SOLN: 4.0000 mg | Freq: Four times a day (QID) | INTRAMUSCULAR | Status: DC | PRN

## 2014-01-04 MED ORDER — PROPOFOL 10 MG/ML IV BOLUS
INTRAVENOUS | Status: AC
  Filled 2014-01-04: qty 20

## 2014-01-04 MED ORDER — DEXAMETHASONE SODIUM PHOSPHATE 10 MG/ML IJ SOLN
INTRAMUSCULAR | Status: DC | PRN
  Administered 2014-01-04: 10 mg via INTRAVENOUS

## 2014-01-04 MED ORDER — ONDANSETRON HCL 4 MG/2ML IJ SOLN: 4.0000 mg | Freq: Once | INTRAMUSCULAR | Status: DC

## 2014-01-04 MED ORDER — METOCLOPRAMIDE HCL 5 MG/ML IJ SOLN: 10.0000 mg | Freq: Once | INTRAMUSCULAR | Status: DC | PRN

## 2014-01-04 MED ORDER — SODIUM CHLORIDE 0.9 % IV BOLUS (SEPSIS)
1000.0000 mL | Freq: Once | INTRAVENOUS | Status: AC
  Administered 2014-01-04: 1000 mL via INTRAVENOUS

## 2014-01-04 MED ORDER — 0.9 % SODIUM CHLORIDE (POUR BTL) OPTIME
TOPICAL | Status: DC | PRN
  Administered 2014-01-04: 1000 mL

## 2014-01-04 MED ORDER — DEXAMETHASONE SODIUM PHOSPHATE 10 MG/ML IJ SOLN
INTRAMUSCULAR | Status: AC
  Filled 2014-01-04: qty 1

## 2014-01-04 MED ORDER — HYDROCODONE-ACETAMINOPHEN 5-325 MG PO TABS
1.0000 | ORAL_TABLET | ORAL | Status: DC | PRN
  Administered 2014-01-04: 2 via ORAL
  Administered 2014-01-05: 1 via ORAL
  Filled 2014-01-04 (×2): qty 2

## 2014-01-04 MED ORDER — GLYCOPYRROLATE 0.2 MG/ML IJ SOLN
INTRAMUSCULAR | Status: AC
  Filled 2014-01-04: qty 3

## 2014-01-04 MED ORDER — BUPIVACAINE-EPINEPHRINE 0.25% -1:200000 IJ SOLN
INTRAMUSCULAR | Status: AC
  Filled 2014-01-04: qty 1

## 2014-01-04 MED ORDER — FENTANYL CITRATE 0.05 MG/ML IJ SOLN
25.0000 ug | INTRAMUSCULAR | Status: DC | PRN
Start: 2014-01-04 — End: 2014-01-04

## 2014-01-04 MED ORDER — ERTAPENEM SODIUM 1 G IJ SOLR
1.0000 g | INTRAMUSCULAR | Status: DC
  Administered 2014-01-04: 1 g via INTRAVENOUS
  Filled 2014-01-04: qty 1

## 2014-01-04 MED ORDER — KCL IN DEXTROSE-NACL 20-5-0.9 MEQ/L-%-% IV SOLN
INTRAVENOUS | Status: DC
  Administered 2014-01-04 – 2014-01-05 (×2): via INTRAVENOUS
  Filled 2014-01-04 (×3): qty 1000

## 2014-01-04 MED ORDER — BUPIVACAINE-EPINEPHRINE 0.25% -1:200000 IJ SOLN
INTRAMUSCULAR | Status: DC | PRN
  Administered 2014-01-04: 20 mL

## 2014-01-04 MED ORDER — HEPARIN SODIUM (PORCINE) 5000 UNIT/ML IJ SOLN
5000.0000 [IU] | Freq: Three times a day (TID) | INTRAMUSCULAR | Status: DC
Start: 2014-01-05 — End: 2014-01-05
  Administered 2014-01-05: 5000 [IU] via SUBCUTANEOUS
  Filled 2014-01-04 (×4): qty 1

## 2014-01-04 MED ORDER — PROPOFOL 10 MG/ML IV BOLUS
INTRAVENOUS | Status: DC | PRN
  Administered 2014-01-04: 180 mg via INTRAVENOUS

## 2014-01-04 MED ORDER — NEOSTIGMINE METHYLSULFATE 10 MG/10ML IV SOLN
INTRAVENOUS | Status: DC | PRN
  Administered 2014-01-04: 4 mg via INTRAVENOUS

## 2014-01-04 MED ORDER — ONDANSETRON HCL 4 MG PO TABS: 4.0000 mg | ORAL_TABLET | Freq: Four times a day (QID) | ORAL | Status: DC | PRN

## 2014-01-04 MED ORDER — LISINOPRIL 20 MG PO TABS
20.0000 mg | ORAL_TABLET | Freq: Every day | ORAL | Status: DC
  Administered 2014-01-04: 20 mg via ORAL
  Filled 2014-01-04 (×2): qty 1

## 2014-01-04 MED ORDER — MIDAZOLAM HCL 5 MG/5ML IJ SOLN
INTRAMUSCULAR | Status: DC | PRN
  Administered 2014-01-04 (×2): 1 mg via INTRAVENOUS

## 2014-01-04 MED ORDER — SUCCINYLCHOLINE CHLORIDE 20 MG/ML IJ SOLN
INTRAMUSCULAR | Status: DC | PRN
  Administered 2014-01-04: 120 mg via INTRAVENOUS

## 2014-01-04 MED ORDER — ONDANSETRON HCL 4 MG/2ML IJ SOLN
INTRAMUSCULAR | Status: DC | PRN
  Administered 2014-01-04 (×2): 2 mg via INTRAVENOUS

## 2014-01-04 MED ORDER — GABAPENTIN 100 MG PO CAPS
300.0000 mg | ORAL_CAPSULE | Freq: Every day | ORAL | Status: DC
  Administered 2014-01-04: 400 mg via ORAL
  Filled 2014-01-04 (×2): qty 5

## 2014-01-04 MED ORDER — LISINOPRIL-HYDROCHLOROTHIAZIDE 20-12.5 MG PO TABS: 1.0000 | ORAL_TABLET | Freq: Every day | ORAL | Status: DC

## 2014-01-04 MED ORDER — MIDAZOLAM HCL 2 MG/2ML IJ SOLN
INTRAMUSCULAR | Status: AC
Start: 2014-01-04 — End: 2014-01-04
  Filled 2014-01-04: qty 2

## 2014-01-04 SURGICAL SUPPLY — 39 items
ADH SKN CLS APL DERMABOND .7 (GAUZE/BANDAGES/DRESSINGS)
APPLIER CLIP ROT 10 11.4 M/L (STAPLE)
APR CLP MED LRG 11.4X10 (STAPLE)
BAG SPEC RTRVL LRG 6X4 10 (ENDOMECHANICALS) ×1
CANISTER SUCTION 2500CC (MISCELLANEOUS) ×1 IMPLANT
CLIP APPLIE ROT 10 11.4 M/L (STAPLE) IMPLANT
CUTTER FLEX LINEAR 45M (STAPLE) ×1 IMPLANT
DECANTER SPIKE VIAL GLASS SM (MISCELLANEOUS) ×2 IMPLANT
DERMABOND ADVANCED (GAUZE/BANDAGES/DRESSINGS)
DERMABOND ADVANCED .7 DNX12 (GAUZE/BANDAGES/DRESSINGS) ×1 IMPLANT
DRAPE LAPAROSCOPIC ABDOMINAL (DRAPES) ×2 IMPLANT
DRAPE UTILITY XL STRL (DRAPES) ×2 IMPLANT
ELECT REM PT RETURN 9FT ADLT (ELECTROSURGICAL) ×2
ELECTRODE REM PT RTRN 9FT ADLT (ELECTROSURGICAL) ×1 IMPLANT
ENDOLOOP SUT PDS II  0 18 (SUTURE)
ENDOLOOP SUT PDS II 0 18 (SUTURE) IMPLANT
GLOVE BIO SURGEON STRL SZ7.5 (GLOVE) ×2 IMPLANT
GOWN STRL REUS W/ TWL XL LVL3 (GOWN DISPOSABLE) ×1 IMPLANT
GOWN STRL REUS W/TWL XL LVL3 (GOWN DISPOSABLE) ×6 IMPLANT
IV LACTATED RINGERS 1000ML (IV SOLUTION) ×2 IMPLANT
KIT BASIN OR (CUSTOM PROCEDURE TRAY) ×2 IMPLANT
NS IRRIG 1000ML POUR BTL (IV SOLUTION) ×2 IMPLANT
PENCIL BUTTON HOLSTER BLD 10FT (ELECTRODE) ×2 IMPLANT
POUCH SPECIMEN RETRIEVAL 10MM (ENDOMECHANICALS) ×1 IMPLANT
RELOAD 45 VASCULAR/THIN (ENDOMECHANICALS) IMPLANT
RELOAD STAPLE 45 2.5 WHT GRN (ENDOMECHANICALS) IMPLANT
RELOAD STAPLE 45 3.5 BLU ETS (ENDOMECHANICALS) IMPLANT
RELOAD STAPLE TA45 3.5 REG BLU (ENDOMECHANICALS) ×2 IMPLANT
SET IRRIG TUBING LAPAROSCOPIC (IRRIGATION / IRRIGATOR) ×2 IMPLANT
SHEARS HARMONIC ACE PLUS 36CM (ENDOMECHANICALS) ×1 IMPLANT
SOLUTION ANTI FOG 6CC (MISCELLANEOUS) ×2 IMPLANT
SUT MNCRL AB 4-0 PS2 18 (SUTURE) ×1 IMPLANT
TOWEL OR 17X26 10 PK STRL BLUE (TOWEL DISPOSABLE) ×2 IMPLANT
TRAY FOLEY CATH 14FRSI W/METER (CATHETERS) ×2 IMPLANT
TRAY LAPAROSCOPIC (CUSTOM PROCEDURE TRAY) ×2 IMPLANT
TROCAR BLADELESS OPT 5 75 (ENDOMECHANICALS) ×2 IMPLANT
TROCAR SLEEVE XCEL 5X75 (ENDOMECHANICALS) ×2 IMPLANT
TROCAR XCEL BLUNT TIP 100MML (ENDOMECHANICALS) ×2 IMPLANT
TUBING INSUFFLATION 10FT LAP (TUBING) ×2 IMPLANT

## 2014-01-04 NOTE — ED Notes (Signed)
Surgery consult at the bedside

## 2014-01-04 NOTE — ED Provider Notes (Signed)
Medical screening examination/treatment/procedure(s) were performed by non-physician practitioner and as supervising physician I was immediately available for consultation/collaboration.   EKG Interpretation   Date/Time:  Wednesday January 04 2014 16:37:06 EDT Ventricular Rate:  67 PR Interval:  179 QRS Duration: 94 QT Interval:  416 QTC Calculation: 439 R Axis:   167 Text Interpretation:  Right and left arm electrode reversal,  interpretation assumes no reversal Sinus rhythm  Baseline wander in  lead(s) II III aVF since last tracing no significant change Confirmed by  Juleen ChinaKOHUT  MD, Hazelyn Kallen ([id number]) on 01/04/2014 4:40:26 PM       Raeford RazorStephen Matelyn Antonelli, MD 01/04/14 1949

## 2014-01-04 NOTE — H&P (Signed)
Dana [name] is an 58 y.o. female.   Chief Complaint: abdominal pain HPI: The pt is a 59 yo wf who began having right sided abdominal pain about 3am. Pain worsened through the am. She went to medical doc who got a CT which showed appendicitis but no evidence of rupture. One episode of vomiting. No fever.  Past Medical History  Diagnosis Date  . Postmenopausal atrophic vaginitis   . Iron deficiency anemia, unspecified   . IBS (irritable bowel syndrome)   . Obesity   . OSA (obstructive sleep apnea)     with AHI 10.79/hr with daytime sleepiness now on CPAP   . Hypertension     Past Surgical History  Procedure Laterality Date  . Fracture surgery    . Cesarean section    . Laparoscopy      Family History  Problem Relation Age of Onset  . Cancer Mother   . Hypertension Brother    Social History:  reports that she quit smoking about 17 years ago. Her smoking use included Cigarettes. She has a 20 pack-year smoking history. She has never used smokeless tobacco. She reports that she does not drink alcohol or use illicit drugs.  Allergies:  Allergies  Allergen Reactions  . Codeine Other (See Comments)    Upset stomach, maybe cramps      (Not in a hospital admission)  Results for orders placed during the hospital encounter of 01/04/14 (from the past 48 hour(s))  CBC WITH DIFFERENTIAL     Status: Abnormal   Collection Time    01/04/14  4:48 PM      Result Value Ref Range   WBC 9.0  4.0 - 10.5 K/uL   RBC 4.41  3.87 - 5.11 MIL/uL   Hemoglobin 12.7  12.0 - 15.0 g/dL   HCT 36.3  36.0 - 46.0 %   MCV 82.3  78.0 - 100.0 fL   MCH 28.8  26.0 - 34.0 pg   MCHC 35.0  30.0 - 36.0 g/dL   RDW 12.4  11.5 - 15.5 %   Platelets 151  150 - 400 K/uL   Neutrophils Relative % 82 (*) 43 - 77 %   Neutro Abs 7.4  1.7 - 7.7 K/uL   Lymphocytes Relative 12  12 - 46 %   Lymphs Abs 1.1  0.7 - 4.0 K/uL   Monocytes Relative 6  3 - 12 %   Monocytes Absolute 0.6  0.1 - 1.0 K/uL   Eosinophils Relative 0   0 - 5 %   Eosinophils Absolute 0.0  0.0 - 0.7 K/uL   Basophils Relative 0  0 - 1 %   Basophils Absolute 0.0  0.0 - 0.1 K/uL  BASIC METABOLIC PANEL     Status: Abnormal   Collection Time    01/04/14  4:48 PM      Result Value Ref Range   Sodium 135 (*) 137 - 147 mEq/L   Potassium 3.5 (*) 3.7 - 5.3 mEq/L   Chloride 95 (*) 96 - 112 mEq/L   CO2 25  19 - 32 mEq/L   Glucose, Bld 113 (*) 70 - 99 mg/dL   BUN 13  6 - 23 mg/dL   Creatinine, Ser 0.72  0.50 - 1.10 mg/dL   Calcium 9.4  8.4 - 10.5 mg/dL   GFR calc non Af Amer >90  >90 mL/min   GFR calc Af Amer >90  >90 mL/min   Comment: (NOTE)     The eGFR  has been calculated using the CKD EPI equation.     This calculation has not been validated in all clinical situations.     eGFR's persistently <90 mL/min signify possible Chronic Kidney     Disease.   Anion gap 15  5 - 15  SAMPLE TO BLOOD BANK     Status: None   Collection Time    01/04/14  4:48 PM      Result Value Ref Range   Blood Bank Specimen SAMPLE AVAILABLE FOR TESTING     Sample Expiration 01/07/2014     Dg Chest 2 View  01/04/2014   CLINICAL DATA:  Preop for appendectomy  EXAM: CHEST  2 VIEW  COMPARISON:  07/31/2011  FINDINGS: Cardiomediastinal silhouette is stable. No acute infiltrate or pleural effusion. No pulmonary edema. Postsurgical changes are noted left humerus. Stable degenerative changes thoracic spine.  IMPRESSION: No active cardiopulmonary disease.   Electronically Signed   By: Lahoma Crocker M.D.   On: 01/04/2014 17:30   Ct Abdomen Pelvis W Contrast  01/04/2014   CLINICAL DATA:  Right lower quadrant abdominal pain, acute onset 24 hours ago, some nausea  EXAM: CT ABDOMEN AND PELVIS WITH CONTRAST  TECHNIQUE: Multidetector CT imaging of the abdomen and pelvis was performed using the standard protocol following bolus administration of intravenous contrast.  CONTRAST:  174m OMNIPAQUE IOHEXOL 300 MG/ML  SOLN  COMPARISON:  None.  FINDINGS: The lung bases are clear. The liver  enhances with only a single low-attenuation structure in the left lobe near the dome which appears to enhance in a nodular fashion peripherally consistent with a left lobe of liver hemangioma. No other hepatic abnormality is noted. No calcified gallstones are seen. The pancreas is normal in size and the pancreatic duct is not dilated. The adrenal glands and spleen are unremarkable. The stomach is moderately fluid distended with no abnormality noted. The kidneys enhance with no calculus or mass and on delayed images, the pelvocaliceal systems are unremarkable and the proximal ureters are normal in caliber. The abdominal aorta is normal in caliber. No adenopathy is seen.  The appendix appears edematous within the right pelvis-right lower quadrant extending slightly cephalad toward the right iliac crest. There is mild adjacent strandiness, findings consistent with early acute appendicitis. No abscess or evidence of perforation is seen. Several slightly prominent lymph nodes are present in the adjacent fat planes. No abscess is seen.  The urinary bladder is not well distended. The uterus is normal in size. No adnexal lesion is seen. No fluid is noted within the pelvis. There are scattered rectosigmoid colonic diverticula present. The terminal ileum is unremarkable. Mild degenerative disc disease is noted at L5-S1 with degenerative change in the facet joints of the lower lumbar spine as well. A few collections of air are noted in the right buttocks probably due to recent intramuscular injection.  IMPRESSION: 1. Edematous appearing appendix with mild periappendiceal strandiness, consistent with uncomplicated acute appendicitis. 2. Probable hemangioma in the left lobe of liver. Critical Value/emergent results were called by telephone at the time of interpretation on 01/04/2014 at 2:58 pm to Dr. NLennie Odor, who verbally acknowledged these results.   Electronically Signed   By: PIvar DrapeM.D.   On: 01/04/2014 14:58     Review of Systems  Constitutional: Negative.   HENT: Negative.   Eyes: Negative.   Respiratory: Negative.   Cardiovascular: Negative.   Gastrointestinal: Positive for nausea, vomiting and abdominal pain.  Genitourinary: Negative.   Musculoskeletal: Negative.  Skin: Negative.   Neurological: Negative.   Endo/Heme/Allergies: Negative.   Psychiatric/Behavioral: Negative.     Blood pressure 117/69, pulse 70, temperature 98.5 F (36.9 C), temperature source Oral, resp. rate 17, SpO2 100.00%. Physical Exam  Constitutional: She is oriented to person, place, and time. She appears well-developed and well-nourished.  HENT:  Head: Normocephalic and atraumatic.  Eyes: Conjunctivae and EOM are normal. Pupils are equal, round, and reactive to light.  Neck: Normal range of motion. Neck supple.  Cardiovascular: Normal rate, regular rhythm and normal heart sounds.   Respiratory: Effort normal and breath sounds normal.  GI: Soft. Bowel sounds are normal.  There is focal tenderness in the RLQ. No peritonitis  Musculoskeletal: Normal range of motion.  Neurological: She is alert and oriented to person, place, and time.  Skin: Skin is warm and dry.  Psychiatric: She has a normal mood and affect. Her behavior is normal.     Assessment/Plan The pt appears to have acute appendicitis. Because of the risk of perforation and sepsis I think she would benefit from having her appendix removed. I have discussed with her the risks and benefits of surgery as well as some of the technical aspects and she understands and wishes to proceed. Plan for lap appy tonight  TOTH III,Vimal Derego S 01/04/2014, 6:57 PM

## 2014-01-04 NOTE — ED Notes (Signed)
Unable to complete ekg at this time because pharmacy and PA are in the room I will do when they leave.

## 2014-01-04 NOTE — Op Note (Signed)
01/04/2014  9:01 PM  PATIENT:  Curly RimSarah M Russum  58 y.o. female  PRE-OPERATIVE DIAGNOSIS:  appendictis  POST-OPERATIVE DIAGNOSIS:  appendicitis  PROCEDURE:  Procedure(s): APPENDECTOMY LAPAROSCOPIC (N/A)  SURGEON:  Surgeon(s) and Role:    * Griselda MinerPaul Toth III, MD - Primary  PHYSICIAN ASSISTANT:   ASSISTANTS: none   ANESTHESIA:   general  EBL:  Total I/O In: 1000 [I.V.:1000] Out: 250 [Urine:250]  BLOOD ADMINISTERED:none  DRAINS: none   LOCAL MEDICATIONS USED:  MARCAINE     SPECIMEN:  Source of Specimen:  appendix  DISPOSITION OF SPECIMEN:  PATHOLOGY  COUNTS:  YES  TOURNIQUET:  * No tourniquets in log *  DICTATION: .Dragon Dictation After informed consent was obtained patient was brought to the operating room placed in the supine position on the operating room table. After adequate induction of general anesthesia the patient's abdomen was prepped with ChloraPrep, allowed to dry, and draped in usual sterile manner. The area below the umbilicus was infiltrated with quarter percent Marcaine. A small incision was made with a 15 blade knife. This incision was carried down through the subcutaneous tissue bluntly with a hemostat and [employer] retractors until the linea alba was identified. The linea alba was incised with a 15 blade knife. Each side was grasped Coker clamps and elevated anteriorly. The preperitoneal space was probed bluntly with a hemostat until the peritoneum was opened and access was gained to the abdominal cavity. A 0 Vicryl purse string stitch was placed in the fascia surrounding the opening. A Hassan cannula was placed through the opening and anchored in place with the previously placed Vicryl purse string stitch. The laparoscope was [facility]. The abdomen was insufflated with carbon dioxide without difficulty. Next the suprapubic area was infiltrated with quarter percent Marcaine. A small incision was made with a 15 blade knife. A 5 mm port was  placed bluntly through this incision into the abdominal cavity. A site was then chosen between the 2 port for placement of a 5 mm port. The area was infiltrated with quarter percent Marcaine. A small stab incision was made with a 15 blade knife. A 5 mm port was placed bluntly through this incision and the abdominal cavity under direct vision. The laparoscope was then moved to the suprapubic port. Using a Glassman grasper and harmonic scalpel the right lower quadrant was inspected. The appendix was readily identified. The appendix was elevated anteriorly and the mesoappendix was taken down sharply with the harmonic scalpel. Once the base of the appendix where it joined the cecum was identified and cleared of any tissue then a laparoscopic GIA blue load 6 row stapler was placed through the [facility]. The stapler was placed across the base of the appendix clamped and fired thereby dividing the base of the appendix between staple lines. A laparoscopic bag was then inserted through the [facility]. The appendix was placed within the bag and the bag was sealed. The abdomen was then irrigated with copious amounts of saline until the effluent was clear. No other abnormalities were noted. The appendix and bag were [facility] with the [facility] through the infraumbilical port without difficulty. The fascial defect was closed with the previously placed Vicryl pursestring stitch as well as with another interrupted 0 Vicryl figure-of-eight stitch. The rest of the ports were removed under direct vision and were found to be hemostatic. The gas was allowed to escape. The skin incisions were closed with interrupted 4-0 Monocryl subcuticular stitches. Dermabond dressings were applied.  The patient tolerated the procedure well. At the end of the case all needle sponge and instrument counts were correct. The patient was then awakened and taken to recovery in stable condition.  PLAN OF CARE: Admit for overnight  observation  PATIENT DISPOSITION:  PACU - hemodynamically stable.   Delay start of Pharmacological VTE agent (>24hrs) due to surgical blood loss or risk of bleeding: no

## 2014-01-04 NOTE — Anesthesia Postprocedure Evaluation (Signed)
  Anesthesia Post-op Note  Patient: Dana [name]  Procedure(s) Performed: Procedure(s): APPENDECTOMY LAPAROSCOPIC (N/A)  Patient Location: PACU  Anesthesia Type:General  Level of Consciousness: awake, alert  and oriented  Airway and Oxygen Therapy: Patient Spontanous Breathing and Patient connected to nasal cannula oxygen  Post-op Pain: mild  Post-op Assessment: Post-op Vital signs reviewed, Patient's Cardiovascular Status Stable, Respiratory Function Stable, Patent Airway, No signs of Nausea or vomiting and Pain level controlled  Post-op Vital Signs: Reviewed and stable  Last Vitals:  Filed Vitals:   01/04/14 2202  BP: 133/71  Pulse: 67  Temp: 36.9 C  Resp:     Complications: No apparent anesthesia complications

## 2014-01-04 NOTE — Anesthesia Procedure Notes (Signed)
Procedure Name: Intubation Date/Time: 01/04/2014 7:55 PM Performed by: Edison PaceGRAY, Neyland Pettengill E Pre-anesthesia Checklist: Patient identified, Emergency Drugs available, Timeout performed, Suction available and Patient being monitored Patient Re-evaluated:Patient Re-evaluated prior to inductionOxygen Delivery Method: Circle system utilized Preoxygenation: Pre-oxygenation with 100% oxygen Intubation Type: IV induction, Rapid sequence and Cricoid Pressure applied Laryngoscope Size: Mac and 4 Grade View: Grade III Tube type: Oral Tube size: 7.5 mm Number of attempts: 1 Airway Equipment and Method: Stylet Placement Confirmation: positive ETCO2 and breath sounds checked- equal and bilateral (arytenoids visualized only with cricoid pressure/head lift) Secured at: 20 cm Tube secured with: Tape Dental Injury: Teeth and Oropharynx as per pre-operative assessment  Difficulty Due To: Difficulty was anticipated, Difficult Airway- due to limited oral opening and Difficult Airway- due to anterior larynx Future Recommendations: Recommend- induction with short-acting agent, and alternative techniques readily available

## 2014-01-04 NOTE — ED Notes (Signed)
Pt has RLQ pain that started this morning. Pt had CT scan done today and shows appendicitis.  Pt was sent here for general surgery.

## 2014-01-04 NOTE — ED Notes (Signed)
To OR via stretcher. Accompanied by OR transporter and family.

## 2014-01-04 NOTE — Anesthesia Preprocedure Evaluation (Addendum)
Anesthesia Evaluation  Patient identified by MRN, date of birth, ID band Patient awake    Reviewed: Allergy & Precautions, H&P , NPO status , Patient's Chart, lab work & pertinent test results  Airway Mallampati: III TM Distance: >3 FB Neck ROM: Full    Dental no notable dental hx. (+) Teeth Intact   Pulmonary sleep apnea and Continuous Positive Airway Pressure Ventilation , former smoker,  breath sounds clear to auscultation  Pulmonary exam normal       Cardiovascular hypertension, Pt. on medications negative cardio ROS  Rhythm:Regular Rate:Normal     Neuro/Psych negative neurological ROS  negative psych ROS   GI/Hepatic Neg liver ROS, Hx/o IBS Acute Appendicitis   Endo/Other  Obesity  Renal/GU negative Renal ROS  negative genitourinary   Musculoskeletal negative musculoskeletal ROS (+)   Abdominal (+) + obese,   Peds  Hematology  (+) anemia ,   Anesthesia Other Findings   Reproductive/Obstetrics Hx/o Atrophic vaginitis                          Anesthesia Physical Anesthesia Plan  ASA: II and emergent  Anesthesia Plan: General   Post-op Pain Management:    Induction: Intravenous, Rapid sequence and Cricoid pressure planned  Airway Management Planned: Oral ETT  Additional Equipment:   Intra-op Plan:   Post-operative Plan: Extubation in OR  Informed Consent: I have reviewed the patients History and Physical, chart, labs and discussed the procedure including the risks, benefits and alternatives for the proposed anesthesia with the patient or authorized representative who has indicated his/her understanding and acceptance.   Dental advisory given  Plan Discussed with: Anesthesiologist, CRNA and Surgeon  Anesthesia Plan Comments:        Anesthesia Quick Evaluation

## 2014-01-04 NOTE — ED Provider Notes (Signed)
CSN: [id number]     Arrival date & time 01/04/14  1548 History   First MD Initiated Contact with Patient 01/04/14 1616     Chief Complaint  Patient presents with  . appendicitis      (Consider location/radiation/quality/duration/timing/severity/associated sxs/prior Treatment) HPI  Dana [name] is a 58 y.o. female with past medical history significant for iron deficiency anemia, hypertension, obstructive sleep apnea sent from primary care after CT reveals uncomplicated appendicitis. Patient had epigastric pain which woke her from sleep at 3 AM worsening over the day. She had single episode of nonbloody, nonbilious, coffee ground emesis in the early afternoon. She denies fever, chills,  change in bowel or bladder habits. Pain is 8/10 at worst, she received a IM shot of Toradol at primary care which has largely relieved the pain to 2/10. Last oral intake was water before arrival to the ED, she has not eaten any solid foods today.  Past Medical History  Diagnosis Date  . Postmenopausal atrophic vaginitis   . Iron deficiency anemia, unspecified   . IBS (irritable bowel syndrome)   . Obesity   . OSA (obstructive sleep apnea)     with AHI 10.79/hr with daytime sleepiness now on CPAP   . Hypertension    Past Surgical History  Procedure Laterality Date  . Fracture surgery    . Cesarean section    . Laparoscopy     Family History  Problem Relation Age of Onset  . Cancer Mother   . Hypertension Brother    History  Substance Use Topics  . Smoking status: Former Smoker -- 1.00 packs/day for 20 years    Types: Cigarettes    Quit date: 05/06/1996  . Smokeless tobacco: Never Used  . Alcohol Use: No   OB History   Grav Para Term Preterm Abortions TAB SAB Ect Mult Living                 Review of Systems  10 systems reviewed and found to be negative, except as noted in the HPI.   Allergies  Codeine  Home Medications   Prior to Admission medications   Medication Sig Start  Date End Date Taking? Authorizing Provider  conjugated estrogens (PREMARIN) vaginal cream Place 1 Applicatorful vaginally once a week. Only use's if she needs it   Yes Historical Provider, MD  gabapentin (NEURONTIN) 100 MG capsule Take 300-500 mg by mouth at bedtime.    Yes Historical Provider, MD  ibuprofen (ADVIL,MOTRIN) 200 MG tablet Take 400 mg by mouth every 6 (six) hours as needed for moderate pain.   Yes Historical Provider, MD  ketorolac (TORADOL) 60 MG/2ML SOLN injection Inject 60 mg into the muscle once.   Yes Historical Provider, MD  lisinopril-hydrochlorothiazide (PRINZIDE,ZESTORETIC) 20-12.5 MG per tablet Take 1 tablet by mouth at bedtime.   Yes Historical Provider, MD  naproxen sodium (ANAPROX) 220 MG tablet Take 440 mg by mouth 2 (two) times daily as needed (for pain).   Yes Historical Provider, MD   BP 135/72  Pulse 76  Temp(Src) 98.5 F (36.9 C) (Oral)  Resp 18  SpO2 100% Physical Exam  Nursing note and vitals reviewed. Constitutional: She is oriented to person, place, and time. She appears well-developed and well-nourished. No distress.  HENT:  Head: Normocephalic.  Eyes: Conjunctivae and EOM are normal.  Cardiovascular: Normal rate.   Pulmonary/Chest: Effort normal. No stridor.  Abdominal: Soft. Bowel sounds are normal. She exhibits no distension and no mass. There is tenderness. There  is no rebound and no guarding.  Normoactive bowel sounds, tender to palpation in the epigastrium, right upper or right lower quadrant. Rovsing, obturator and psoas are negative.  Musculoskeletal: Normal range of motion.  Neurological: She is alert and oriented to person, place, and time.  Psychiatric: She has a normal mood and affect.    ED Course  Procedures (including critical care time) Labs Review Labs Reviewed  CBC WITH DIFFERENTIAL - Abnormal; Notable for the following:    Neutrophils Relative % 82 (*)    All other components within normal limits  BASIC METABOLIC PANEL -  Abnormal; Notable for the following:    Sodium 135 (*)    Potassium 3.5 (*)    Chloride 95 (*)    Glucose, Bld 113 (*)    All other components within normal limits  SAMPLE TO BLOOD BANK    Imaging Review Dg Chest 2 View  01/04/2014   CLINICAL DATA:  Preop for appendectomy  EXAM: CHEST  2 VIEW  COMPARISON:  07/31/2011  FINDINGS: Cardiomediastinal silhouette is stable. No acute infiltrate or pleural effusion. No pulmonary edema. Postsurgical changes are noted left humerus. Stable degenerative changes thoracic spine.  IMPRESSION: No active cardiopulmonary disease.   Electronically Signed   By: Natasha MeadLiviu  Pop M.D.   On: 01/04/2014 17:30   Ct Abdomen Pelvis W Contrast  01/04/2014   CLINICAL DATA:  Right lower quadrant abdominal pain, acute onset 24 hours ago, some nausea  EXAM: CT ABDOMEN AND PELVIS WITH CONTRAST  TECHNIQUE: Multidetector CT imaging of the abdomen and pelvis was performed using the standard protocol following bolus administration of intravenous contrast.  CONTRAST:  100mL OMNIPAQUE IOHEXOL 300 MG/ML  SOLN  COMPARISON:  None.  FINDINGS: The lung bases are clear. The liver enhances with only a single low-attenuation structure in the left lobe near the dome which appears to enhance in a nodular fashion peripherally consistent with a left lobe of liver hemangioma. No other hepatic abnormality is noted. No calcified gallstones are seen. The pancreas is normal in size and the pancreatic duct is not dilated. The adrenal glands and spleen are unremarkable. The stomach is moderately fluid distended with no abnormality noted. The kidneys enhance with no calculus or mass and on delayed images, the pelvocaliceal systems are unremarkable and the proximal ureters are normal in caliber. The abdominal aorta is normal in caliber. No adenopathy is seen.  The appendix appears edematous within the right pelvis-right lower quadrant extending slightly cephalad toward the right iliac crest. There is mild adjacent  strandiness, findings consistent with early acute appendicitis. No abscess or evidence of perforation is seen. Several slightly prominent lymph nodes are present in the adjacent fat planes. No abscess is seen.  The urinary bladder is not well distended. The uterus is normal in size. No adnexal lesion is seen. No fluid is noted within the pelvis. There are scattered rectosigmoid colonic diverticula present. The terminal ileum is unremarkable. Mild degenerative disc disease is noted at L5-S1 with degenerative change in the facet joints of the lower lumbar spine as well. A few collections of air are noted in the right buttocks probably due to recent intramuscular injection.  IMPRESSION: 1. Edematous appearing appendix with mild periappendiceal strandiness, consistent with uncomplicated acute appendicitis. 2. Probable hemangioma in the left lobe of liver. Critical Value/emergent results were called by telephone at the time of interpretation on 01/04/2014 at 2:58 pm to Dr. Milus HeightNOELLE REDMON , who verbally acknowledged these results.   Electronically Signed  By: Dwyane DeePaul  Barry M.D.   On: 01/04/2014 14:58     EKG Interpretation   Date/Time:  Wednesday January 04 2014 16:37:06 EDT Ventricular Rate:  67 PR Interval:  179 QRS Duration: 94 QT Interval:  416 QTC Calculation: 439 R Axis:   167 Text Interpretation:  Right and left arm electrode reversal,  interpretation assumes no reversal Sinus rhythm  Baseline wander in  lead(s) II III aVF since last tracing no significant change Confirmed by  Juleen ChinaKOHUT  MD, STEPHEN ([id number]) on 01/04/2014 4:40:26 PM      MDM   Final diagnoses:  Acute appendicitis with localized peritonitis    Filed Vitals:   01/04/14 1614 01/04/14 1917  BP: 117/69 135/72  Pulse: 70 76  Temp: 98.5 F (36.9 C) 98.5 F (36.9 C)  TempSrc: Oral Oral  Resp: 17 18  SpO2: 100% 100%    Medications  ondansetron (ZOFRAN) injection 4 mg (4 mg Intravenous Not Given 01/04/14 1704)  ertapenem  (INVANZ) 1 g in sodium chloride 0.9 % 50 mL IVPB (1 g Intravenous New Bag/Given 01/04/14 1915)  sodium chloride 0.9 % bolus 1,000 mL (0 mLs Intravenous Stopped 01/04/14 1730)    Dana [name] is a 58 y.o. female presenting with acute appendicitis. No signs of perforation or peritoneal signs. Patient tries to remain n.p.o., preop labs and studies ordered. Patient to declines pain medication at this time.  Case discussed with [facility] Dr. Carolynne Edouardoth, he will come to evaluate the patient.  6:15 PM: Patient seen and evaluated the bedside, she is resting comfortably, declines pain medication. Abdominal exam remains unchanged.      Dana Emeryicole Antoinett Dorman, PA-C 01/04/14 1919

## 2014-01-04 NOTE — Transfer of Care (Signed)
Immediate Anesthesia Transfer of Care Note  Patient: Dana [name]  Procedure(s) Performed: Procedure(s): APPENDECTOMY LAPAROSCOPIC (N/A)  Patient Location: PACU  Anesthesia Type:General  Level of Consciousness: awake, oriented, patient cooperative, lethargic and responds to stimulation  Airway & Oxygen Therapy: Patient Spontanous Breathing and Patient connected to face mask oxygen  Post-op Assessment: Report given to PACU RN, Post -op Vital signs reviewed and stable and Patient moving all extremities  Post vital signs: Reviewed and stable  Complications: No apparent anesthesia complications

## 2014-01-05 MED ORDER — HYDROCODONE-ACETAMINOPHEN 5-325 MG PO TABS: 1.0000 | ORAL_TABLET | Freq: Four times a day (QID) | ORAL | Status: AC | PRN

## 2014-01-05 NOTE — Discharge Summary (Signed)
Clinically improving.  Making discharge goals.  Discussed postoperative instructions in detail.  Questions answered.  She expressed appreciation.    Will followup in clinic in a few weeks as long as she continues to recover well   Ardeth SportsmanSteven C. Yulian Gosney, M.D., F.A.C.S. Gastrointestinal and Minimally Invasive Surgery [facility] [address] [place], [place] [address] [phone] Main / Paging

## 2014-01-05 NOTE — Discharge Summary (Signed)
[facility] Discharge Summary   Patient ID: Dana [name] MRN: [id number] DOB/AGE: [birth year] 58 y.o.  Admit date: 01/04/2014 Discharge date: 01/05/2014  Admitting Diagnosis: Acute appendicitis  Discharge Diagnosis Patient Active Problem List   Diagnosis Date Noted  . Acute appendicitis 01/04/2014  . Hypertension   . Postmenopausal atrophic vaginitis   . Iron deficiency anemia, unspecified   . IBS (irritable bowel syndrome)   . Obesity   . OSA (obstructive sleep apnea)     Consultants None  Imaging: Dg Chest 2 View  01/04/2014   CLINICAL DATA:  Preop for appendectomy  EXAM: CHEST  2 VIEW  COMPARISON:  07/31/2011  FINDINGS: Cardiomediastinal silhouette is stable. No acute infiltrate or pleural effusion. No pulmonary edema. Postsurgical changes are noted left humerus. Stable degenerative changes thoracic spine.  IMPRESSION: No active cardiopulmonary disease.   Electronically Signed   By: Natasha MeadLiviu  Pop M.D.   On: 01/04/2014 17:30   Ct Abdomen Pelvis W Contrast  01/04/2014   CLINICAL DATA:  Right lower quadrant abdominal pain, acute onset 24 hours ago, some nausea  EXAM: CT ABDOMEN AND PELVIS WITH CONTRAST  TECHNIQUE: Multidetector CT imaging of the abdomen and pelvis was performed using the standard protocol following bolus administration of intravenous contrast.  CONTRAST:  100mL OMNIPAQUE IOHEXOL 300 MG/ML  SOLN  COMPARISON:  None.  FINDINGS: The lung bases are clear. The liver enhances with only a single low-attenuation structure in the left lobe near the dome which appears to enhance in a nodular fashion peripherally consistent with a left lobe of liver hemangioma. No other hepatic abnormality is noted. No calcified gallstones are seen. The pancreas is normal in size and the pancreatic duct is not dilated. The adrenal glands and spleen are unremarkable. The stomach is moderately fluid distended with no abnormality noted. The kidneys enhance with no calculus or mass  and on delayed images, the pelvocaliceal systems are unremarkable and the proximal ureters are normal in caliber. The abdominal aorta is normal in caliber. No adenopathy is seen.  The appendix appears edematous within the right pelvis-right lower quadrant extending slightly cephalad toward the right iliac crest. There is mild adjacent strandiness, findings consistent with early acute appendicitis. No abscess or evidence of perforation is seen. Several slightly prominent lymph nodes are present in the adjacent fat planes. No abscess is seen.  The urinary bladder is not well distended. The uterus is normal in size. No adnexal lesion is seen. No fluid is noted within the pelvis. There are scattered rectosigmoid colonic diverticula present. The terminal ileum is unremarkable. Mild degenerative disc disease is noted at L5-S1 with degenerative change in the facet joints of the lower lumbar spine as well. A few collections of air are noted in the right buttocks probably due to recent intramuscular injection.  IMPRESSION: 1. Edematous appearing appendix with mild periappendiceal strandiness, consistent with uncomplicated acute appendicitis. 2. Probable hemangioma in the left lobe of liver. Critical Value/emergent results were called by telephone at the time of interpretation on 01/04/2014 at 2:58 pm to Dr. Milus HeightNOELLE REDMON , who verbally acknowledged these results.   Electronically Signed   By: Dwyane DeePaul  Barry M.D.   On: 01/04/2014 14:58    Procedures Dr. Carolynne Edouardoth (01/04/14) - Laparoscopic Appendectomy  Hospital Course:  58 yo wf who began having right sided abdominal pain about 3am. Pain worsened through the am. She went to medical doc who got a CT which showed appendicitis but no evidence of rupture. One episode of vomiting.  No fever.  Patient was admitted and underwent procedure listed above.  Tolerated procedure well and was transferred to the floor.  Diet was advanced as tolerated.  On POD#1, the patient was voiding  well, tolerating diet, ambulating well, pain well controlled, vital signs stable, incisions c/d/i and felt stable for discharge home.  Patient will follow up in our office in 3 weeks and knows to call with questions or concerns.  Physical Exam: General:  Alert, NAD, pleasant, comfortable Abd:  Soft, ND, mild tenderness, incisions C/D/I     Medication List         conjugated estrogens vaginal cream  Commonly known as:  PREMARIN  Place 1 Applicatorful vaginally once a week. Only use's if she needs it     gabapentin 100 MG capsule  Commonly known as:  NEURONTIN  Take 300-500 mg by mouth at bedtime.     HYDROcodone-acetaminophen 5-325 MG per tablet  Commonly known as:  NORCO/VICODIN  Take 1-2 tablets by mouth every 6 (six) hours as needed for moderate pain or severe pain.     ibuprofen 200 MG tablet  Commonly known as:  ADVIL,MOTRIN  Take 400 mg by mouth every 6 (six) hours as needed for moderate pain.     ketorolac 60 MG/2ML Soln injection  Commonly known as:  TORADOL  Inject 60 mg into the muscle once.     lisinopril-hydrochlorothiazide 20-12.5 MG per tablet  Commonly known as:  PRINZIDE,ZESTORETIC  Take 1 tablet by mouth at bedtime.     naproxen sodium 220 MG tablet  Commonly known as:  ANAPROX  Take 440 mg by mouth 2 (two) times daily as needed (for pain).         Follow-up Information   Follow up with [facility] On 01/24/2014. (For post-operation check, Your appointment is at 2:30pm, please arrive at least 30 min before your appointment to complete your check in paperwork.  If you are unable to arrive 30 min prior to your appointment time we may have to cancel or reschedule you)    Contact information:   [address]   [place] [address] [phone]       Signed: Candiss NorseMegan Dort, PA-C [facility] [phone]  01/05/2014, 10:31 AM

## 2014-01-05 NOTE — Care Management Note (Signed)
    Page 1 of 1   01/05/2014     10:17:50 AM CARE MANAGEMENT NOTE 01/05/2014  Patient:  Dana [name],Dana [name]   Account Number:  [id number]  Date Initiated:  01/05/2014  Documentation initiated by:  Lanier ClamMAHABIR,Breon Diss  Subjective/Objective Assessment:   58 Y/O F ADMITTED W/ACUTE APPENDICITIS.     Action/Plan:   FROM HOME.   Anticipated DC Date:  01/05/2014   Anticipated DC Plan:  HOME/SELF CARE      DC Planning Services  CM consult      Choice offered to / List presented to:             Status of service:  Completed, signed off Medicare Important Message given?   (If response is "NO", the following Medicare IM given date fields will be blank) Date Medicare IM given:   Medicare IM given by:   Date Additional Medicare IM given:   Additional Medicare IM given by:    Discharge Disposition:  HOME/SELF CARE  Per UR Regulation:  Reviewed for med. necessity/level of care/duration of stay  If discussed at Long Length of Stay Meetings, dates discussed:    Comments:  01/05/14 Noelene Gang RN,BSN NCM [phone] D/C HOME NO NEEDS OR ORDERS.

## 2014-01-05 NOTE — Discharge Instructions (Addendum)
LAPAROSCOPIC SURGERY: POST OP INSTRUCTIONS  1. DIET: Follow a light bland diet the first 24 hours after arrival home, such as soup, liquids, crackers, etc. Be sure to include lots of fluids daily. Avoid fast food or heavy meals as your are more likely to get nauseated. Eat a low fat the next few days after surgery.  2. Take your usually prescribed home medications unless otherwise directed. 3. PAIN CONTROL:  a. Pain is best controlled by a usual combination of three different methods TOGETHER:  i. Ice/Heat ii. Over the counter pain medication iii. Prescription pain medication b. Most patients will experience some swelling and bruising around the incisions. Ice packs or heating pads (30-60 minutes up to 6 times a day) will help. Use ice for the first few days to help decrease swelling and bruising, then switch to heat to help relax tight/sore spots and speed recovery. Some people prefer to use ice alone, heat alone, alternating between ice & heat. Experiment to what works for you. Swelling and bruising can take several weeks to resolve.  c. It is helpful to take an over-the-counter pain medication regularly for the first few weeks. Choose one of the following that works best for you:  i. Naproxen (Aleve, etc) Two 216m tabs twice a day ii. Ibuprofen (Advil, etc) Three 2026mtabs four times a day (every meal & bedtime) iii. Acetaminophen (Tylenol, etc) 500-65062mour times a day (every meal & bedtime) d. A prescription for pain medication (such as oxycodone, hydrocodone, etc) should be given to you upon discharge. Take your pain medication as prescribed.  i. If you are having problems/concerns with the prescription medicine (does not control pain, nausea, vomiting, rash, itching, etc), please call us [phone] see if we need to switch you to a different pain medicine that will work better for you and/or control your side effect better. ii. If you need a refill on your pain medication, please  contact your pharmacy. They will contact our office to request authorization. Prescriptions will not be filled after 5 pm or on week-ends. 4. Avoid getting constipated. Between the surgery and the pain medications, it is common to experience some constipation. Increasing fluid intake and taking a fiber supplement (such as Metamucil, Citrucel, FiberCon, MiraLax, etc) 1-2 times a day regularly will usually help prevent this problem from occurring. A mild laxative (prune juice, Milk of Magnesia, MiraLax, etc) should be taken according to package directions if there are no bowel movements after 48 hours.  5. Watch out for diarrhea. If you have many loose bowel movements, simplify your diet to bland foods & liquids for a few days. Stop any stool softeners and decrease your fiber supplement. Switching to mild anti-diarrheal medications (Kayopectate, Pepto Bismol) can help. If this worsens or does not improve, please call us.[place]. Wash / shower every day. You may shower over the dressings as they are waterproof. Continue to shower over incision(s) after the dressing is off. 7. Remove your waterproof bandages 5 days after surgery. You may leave the incision open to air. You may replace a dressing/Band-Aid to cover the incision for comfort if you wish.  8. ACTIVITIES as tolerated:  a. You may resume regular (light) daily activities beginning the next day--such as daily self-care, walking, climbing stairs--gradually increasing activities as tolerated. If you can walk 30 minutes without difficulty, it is safe to try more intense activity such as jogging, treadmill, bicycling, low-impact aerobics, swimming, etc. b. Save the most intensive and strenuous activity for  last such as sit-ups, heavy lifting, contact sports, etc Refrain from any heavy lifting or straining until you are off narcotics for pain control.  c. DO NOT PUSH THROUGH PAIN. Let pain be your guide: If it hurts to do something, don't do it. Pain is your body  warning you to avoid that activity for another week until the pain goes down. d. You may drive when you are no longer taking prescription pain medication, you can comfortably wear a seatbelt, and you can safely maneuver your car and apply brakes. e. Bonita QuinYou may have sexual intercourse when it is comfortable.  9. FOLLOW UP in our office  a. Please call [facility] at [phone] to set up an appointment to see your surgeon in the office for a follow-up appointment approximately 2-3 weeks after your surgery. b. Make sure that you call for this appointment the day you arrive home to insure a convenient appointment time.      10. IF YOU HAVE DISABILITY OR FAMILY LEAVE FORMS, BRING THEM TO THE               OFFICE FOR PROCESSING.   WHEN TO CALL US [phone]:  1. Poor pain control 2. Reactions / problems with new medications (rash/itching, nausea, etc)  3. Fever over 101.5 F (38.5 C) 4. Inability to urinate 5. Nausea and/or vomiting 6. Worsening swelling or bruising 7. Continued bleeding from incision. 8. Increased pain, redness, or drainage from the incision  The clinic staff is available to answer your questions during regular business hours (8:30am-5pm). Please dont hesitate to call and ask to speak to one of our nurses for clinical concerns.  If you have a medical emergency, go to the nearest emergency room or call 911.  A surgeon from [facility] is always on call at the [facility]   [facility] [place]  [address], [address], [place], [place] [address] ?  MAIN: [phone] ? TOLL FREE: [phone] ?  FAX [phone]  [email or web address]   Appendicitis Appendicitis is when the appendix is swollen (inflamed). The inflammation can lead to developing a hole (perforation) and a collection of pus (abscess). CAUSES  There is not always an obvious cause of appendicitis. Sometimes it is caused by an obstruction in the appendix. The obstruction can  be caused by:  A small, hard, pea-sized ball of stool (fecalith).  Enlarged lymph glands in the appendix. SYMPTOMS   Pain around your belly button (navel) that moves toward your lower right belly (abdomen). The pain can become more severe and sharp as time passes.  Tenderness in the lower right abdomen. Pain gets worse if you cough or make a sudden movement.  Feeling sick to your stomach (nauseous).  Throwing up (vomiting).  Loss of appetite.  Fever.  Constipation.  Diarrhea.  Generally not feeling well. DIAGNOSIS   Physical exam.  Blood tests.  Urine test.  X-rays or a CT scan may confirm the diagnosis. TREATMENT  Once the diagnosis of appendicitis is made, the most common treatment is to remove the appendix as soon as possible. This procedure is called appendectomy. In an open appendectomy, a cut (incision) is made in the lower right abdomen and the appendix is removed. In a laparoscopic appendectomy, usually 3 small incisions are made. Long, thin instruments and a camera tube are used to remove the appendix. Most patients go home in 24 to 48 hours after appendectomy. In some situations, the appendix may have already perforated and  an abscess may have formed. The abscess may have a "wall" around it as seen on a CT scan. In this case, a drain may be placed into the abscess to remove fluid, and you may be treated with antibiotic medicines that kill germs. The medicine is given through a tube in your vein (IV). Once the abscess has resolved, it may or may not be necessary to have an appendectomy. You may need to stay in the hospital longer than 48 hours. Document Released: 03/10/2005 Document Revised: 09/09/2011 Document Reviewed: 06/05/2009 [facility] Patient Information 2015 [place] [employer] [place]. This information is not intended to replace advice given to you by your health care provider. Make sure you discuss any questions you have with your health care provider.   GETTING TO GOOD  BOWEL HEALTH. Irregular bowel habits such as constipation and diarrhea can lead to many problems over time.  Having one soft bowel movement a day is the most important way to prevent further problems.  The anorectal canal is designed to handle stretching and feces to safely manage our ability to get rid of solid waste (feces, poop, stool) out of our body.  BUT, hard constipated stools can act like ripping concrete bricks and diarrhea can be a burning fire to this very sensitive area of our body, causing inflamed hemorrhoids, anal fissures, increasing risk is perirectal abscesses, abdominal pain/bloating, an making irritable bowel worse.     The goal: ONE SOFT BOWEL MOVEMENT A DAY!  To have soft, regular bowel movements:    Drink at least 8 tall glasses of water a day.     Take plenty of fiber.  Fiber is the undigested part of plant food that passes into the colon, acting s natures broom to encourage bowel motility and movement.  Fiber can absorb and hold large amounts of water. This results in a larger, bulkier stool, which is soft and easier to pass. Work gradually over several weeks up to 6 servings a day of fiber (25g a day even more if needed) in the form of: o Vegetables -- Root (potatoes, carrots, turnips), leafy green (lettuce, salad greens, celery, spinach), or cooked high residue (cabbage, broccoli, etc) o Fruit -- Fresh (unpeeled skin & pulp), Dried (prunes, apricots, cherries, etc ),  or stewed ( applesauce)  o Whole grain breads, pasta, etc (whole wheat)  o Bran cereals    Bulking Agents -- This type of water-retaining fiber generally is easily obtained each day by one of the following:  o Psyllium bran -- The psyllium plant is remarkable because its ground seeds can retain so much water. This product is available as Metamucil, Konsyl, Effersyllium, Per Diem Fiber, or the less expensive generic preparation in drug and health food stores. Although labeled a laxative, it really is not a  laxative.  o Methylcellulose -- This is another fiber derived from wood which also retains water. It is available as Citrucel. o Polyethylene Glycol - and artificial fiber commonly called Miralax or Glycolax.  It is helpful for people with gassy or bloated feelings with regular fiber o Flax Seed - a less gassy fiber than psyllium   No reading or other relaxing activity while on the toilet. If bowel movements take longer than 5 minutes, you are too constipated   AVOID CONSTIPATION.  High fiber and water intake usually takes care of this.  Sometimes a laxative is needed to stimulate more frequent bowel movements, but    Laxatives are not a good long-term solution as it can  wear the colon out. o Osmotics (Milk of Magnesia, Fleets phosphosoda, Magnesium citrate, MiraLax, GoLytely) are safer than  o Stimulants (Senokot, Castor Oil, Dulcolax, Ex Lax)    o Do not take laxatives for more than 7days in a row.    IF SEVERELY CONSTIPATED, try a Bowel Retraining Program: o Do not use laxatives.  o Eat a diet high in roughage, such as bran cereals and leafy vegetables.  o Drink six (6) ounces of prune or apricot juice each morning.  o Eat two (2) large servings of stewed fruit each day.  o Take one (1) heaping tablespoon of a psyllium-based bulking agent twice a day. Use sugar-free sweetener when possible to avoid excessive calories.  o Eat a normal breakfast.  o Set aside 15 minutes after breakfast to sit on the toilet, but do not strain to have a bowel movement.  o If you do not have a bowel movement by the third day, use an enema and repeat the above steps.    Controlling diarrhea o Switch to liquids and simpler foods for a few days to avoid stressing your intestines further. o Avoid dairy products (especially milk & ice cream) for a short time.  The intestines often can lose the ability to digest lactose when stressed. o Avoid foods that cause gassiness or bloating.  Typical foods include beans and  other legumes, cabbage, broccoli, and dairy foods.  Every person has some sensitivity to other foods, so listen to our body and avoid those foods that trigger problems for you. o Adding fiber (Citrucel, Metamucil, psyllium, Miralax) gradually can help thicken stools by absorbing excess fluid and retrain the intestines to act more normally.  Slowly increase the dose over a few weeks.  Too much fiber too soon can backfire and cause cramping & bloating. o Probiotics (such as active yogurt, Align, etc) may help repopulate the intestines and colon with normal bacteria and calm down a sensitive digestive tract.  Most studies show it to be of mild help, though, and such products can be costly. o Medicines:   Bismuth subsalicylate (ex. Kayopectate, Pepto Bismol) every 30 minutes for up to 6 doses can help control diarrhea.  Avoid if pregnant.   Loperamide (Immodium) can slow down diarrhea.  Start with two tablets (4mg  total) first and then try one tablet every 6 hours.  Avoid if you are having fevers or severe pain.  If you are not better or start feeling worse, stop all medicines and call your doctor for advice o Call your doctor if you are getting worse or not better.  Sometimes further testing (cultures, endoscopy, X-ray studies, bloodwork, etc) may be needed to help diagnose and treat the cause of the diarrhea.  Managing Pain  Pain after surgery or related to activity is often due to strain/injury to muscle, tendon, nerves and/or incisions.  This pain is usually short-term and will improve in a few months.   Many people find it helpful to do the following things TOGETHER to help speed the process of healing and to get back to regular activity more quickly:  1. Avoid heavy physical activity a.  no lifting greater than 20 pounds b. Do not push through the pain.  Listen to your body and avoid positions and maneuvers than reproduce the pain c. Walking is okay as tolerated, but go slowly and stop when  getting sore.  d. Remember: If it hurts to do it, then dont do it! 2. Take Anti-inflammatory medication  a.  Take with food/snack around the clock for 1-2 weeks i. This helps the muscle and nerve tissues become less irritable and calm down faster b. Choose ONE of the following over-the-counter medications: i. Naproxen 220mg  tabs (ex. Aleve) 1-2 pills twice a day  ii. Ibuprofen 200mg  tabs (ex. Advil, Motrin) 3-4 pills with every meal and just before bedtime iii. Acetaminophen 500mg  tabs (Tylenol) 1-2 pills with every meal and just before bedtime 3. Use a Heating pad or Ice/Cold Pack a. 4-6 times a day b. May use warm bath/hottub  or showers 4. Try Gentle Massage and/or Stretching  a. at the area of pain many times a day b. stop if you feel pain - do not overdo it  Try these steps together to help you body heal faster and avoid making things get worse.  Doing just one of these things may not be enough.    If you are not getting better after two weeks or are noticing you are getting worse, contact our office for further advice; we may need to re-evaluate you & see what other things we can do to help.

## 2014-01-06 ENCOUNTER — Other Ambulatory Visit: Payer: Self-pay

## 2014-01-06 ENCOUNTER — Encounter (HOSPITAL_COMMUNITY): Payer: Self-pay | Admitting: General Surgery

## 2014-03-03 ENCOUNTER — Other Ambulatory Visit: Payer: Self-pay

## 2014-03-03 DIAGNOSIS — G4733 Obstructive sleep apnea (adult) (pediatric): Secondary | ICD-10-CM

## 2015-06-07 ENCOUNTER — Telehealth: Payer: Self-pay | Admitting: Emergency Medicine

## 2015-09-10 DIAGNOSIS — H5213 Myopia, bilateral: Secondary | ICD-10-CM | POA: Diagnosis not present

## 2015-09-28 DIAGNOSIS — R319 Hematuria, unspecified: Secondary | ICD-10-CM | POA: Diagnosis not present

## 2015-10-19 DIAGNOSIS — R319 Hematuria, unspecified: Secondary | ICD-10-CM | POA: Diagnosis not present

## 2016-01-11 ENCOUNTER — Other Ambulatory Visit: Payer: Self-pay | Admitting: Family Medicine

## 2016-01-11 DIAGNOSIS — K589 Irritable bowel syndrome without diarrhea: Secondary | ICD-10-CM | POA: Diagnosis not present

## 2016-01-11 DIAGNOSIS — F411 Generalized anxiety disorder: Secondary | ICD-10-CM | POA: Diagnosis not present

## 2016-01-11 DIAGNOSIS — Z23 Encounter for immunization: Secondary | ICD-10-CM | POA: Diagnosis not present

## 2016-01-11 DIAGNOSIS — Z0000 Encounter for general adult medical examination without abnormal findings: Secondary | ICD-10-CM | POA: Diagnosis not present

## 2016-01-11 DIAGNOSIS — I10 Essential (primary) hypertension: Secondary | ICD-10-CM | POA: Diagnosis not present

## 2016-01-11 DIAGNOSIS — Z1231 Encounter for screening mammogram for malignant neoplasm of breast: Secondary | ICD-10-CM

## 2016-01-30 ENCOUNTER — Ambulatory Visit
Admission: RE | Admit: 2016-01-30 | Discharge: 2016-01-30 | Disposition: A | Discharge status: Home / Self Care | Payer: BLUE CROSS/BLUE SHIELD | Source: Ambulatory Visit | Attending: Family Medicine | Admitting: Family Medicine

## 2016-01-30 DIAGNOSIS — Z1231 Encounter for screening mammogram for malignant neoplasm of breast: Secondary | ICD-10-CM

## 2016-01-31 DIAGNOSIS — R3915 Urgency of urination: Secondary | ICD-10-CM | POA: Diagnosis not present

## 2016-01-31 DIAGNOSIS — N939 Abnormal uterine and vaginal bleeding, unspecified: Secondary | ICD-10-CM | POA: Diagnosis not present

## 2016-08-06 DIAGNOSIS — R300 Dysuria: Secondary | ICD-10-CM | POA: Diagnosis not present

## 2016-09-19 DIAGNOSIS — H5213 Myopia, bilateral: Secondary | ICD-10-CM | POA: Diagnosis not present

## 2017-01-29 DIAGNOSIS — Z23 Encounter for immunization: Secondary | ICD-10-CM | POA: Diagnosis not present

## 2017-01-29 DIAGNOSIS — Z0000 Encounter for general adult medical examination without abnormal findings: Secondary | ICD-10-CM | POA: Diagnosis not present

## 2017-01-29 DIAGNOSIS — I10 Essential (primary) hypertension: Secondary | ICD-10-CM | POA: Diagnosis not present

## 2017-03-30 DIAGNOSIS — N952 Postmenopausal atrophic vaginitis: Secondary | ICD-10-CM | POA: Diagnosis not present

## 2017-03-30 DIAGNOSIS — N3000 Acute cystitis without hematuria: Secondary | ICD-10-CM | POA: Diagnosis not present

## 2017-03-30 DIAGNOSIS — R350 Frequency of micturition: Secondary | ICD-10-CM | POA: Diagnosis not present

## 2017-07-15 DIAGNOSIS — R21 Rash and other nonspecific skin eruption: Secondary | ICD-10-CM | POA: Diagnosis not present

## 2017-07-29 DIAGNOSIS — E7800 Pure hypercholesterolemia, unspecified: Secondary | ICD-10-CM | POA: Diagnosis not present

## 2018-02-08 DIAGNOSIS — R300 Dysuria: Secondary | ICD-10-CM | POA: Diagnosis not present

## 2018-02-08 DIAGNOSIS — N3090 Cystitis, unspecified without hematuria: Secondary | ICD-10-CM | POA: Diagnosis not present

## 2018-02-24 ENCOUNTER — Other Ambulatory Visit (HOSPITAL_COMMUNITY)
Admission: RE | Admit: 2018-02-24 | Discharge: 2018-02-24 | Disposition: A | Discharge status: Home / Self Care | Payer: BLUE CROSS/BLUE SHIELD | Source: Ambulatory Visit | Attending: Family Medicine | Admitting: Family Medicine

## 2018-02-24 ENCOUNTER — Other Ambulatory Visit: Payer: Self-pay | Admitting: Family Medicine

## 2018-02-24 DIAGNOSIS — Z1231 Encounter for screening mammogram for malignant neoplasm of breast: Secondary | ICD-10-CM

## 2018-02-24 DIAGNOSIS — Z13220 Encounter for screening for lipoid disorders: Secondary | ICD-10-CM | POA: Diagnosis not present

## 2018-02-24 DIAGNOSIS — Z124 Encounter for screening for malignant neoplasm of cervix: Secondary | ICD-10-CM | POA: Diagnosis not present

## 2018-02-24 DIAGNOSIS — E7889 Other lipoprotein metabolism disorders: Secondary | ICD-10-CM | POA: Diagnosis not present

## 2018-02-24 DIAGNOSIS — Z0000 Encounter for general adult medical examination without abnormal findings: Secondary | ICD-10-CM | POA: Diagnosis not present

## 2018-02-24 DIAGNOSIS — Z23 Encounter for immunization: Secondary | ICD-10-CM | POA: Diagnosis not present

## 2018-02-25 LAB — CYTOLOGY - PAP
ADEQUACY: ABSENT
DIAGNOSIS: NEGATIVE
HPV (WINDOPATH): NOT DETECTED

## 2018-09-03 DIAGNOSIS — Z1211 Encounter for screening for malignant neoplasm of colon: Secondary | ICD-10-CM | POA: Diagnosis not present

## 2019-03-29 DIAGNOSIS — Z0000 Encounter for general adult medical examination without abnormal findings: Secondary | ICD-10-CM | POA: Diagnosis not present

## 2019-04-18 DIAGNOSIS — I10 Essential (primary) hypertension: Secondary | ICD-10-CM | POA: Diagnosis not present

## 2019-04-18 DIAGNOSIS — R7303 Prediabetes: Secondary | ICD-10-CM | POA: Diagnosis not present

## 2021-04-25 ENCOUNTER — Other Ambulatory Visit: Payer: Self-pay | Admitting: Family Medicine

## 2021-04-25 DIAGNOSIS — E2839 Other primary ovarian failure: Secondary | ICD-10-CM

## 2021-05-16 ENCOUNTER — Other Ambulatory Visit: Payer: Self-pay | Admitting: Family Medicine

## 2021-05-16 DIAGNOSIS — Z0000 Encounter for general adult medical examination without abnormal findings: Secondary | ICD-10-CM

## 2021-05-28 ENCOUNTER — Ambulatory Visit
Admission: RE | Admit: 2021-05-28 | Discharge: 2021-05-28 | Disposition: A | Discharge status: Home / Self Care | Payer: Medicare Other | Source: Ambulatory Visit | Attending: Family Medicine | Admitting: Family Medicine

## 2021-05-28 ENCOUNTER — Other Ambulatory Visit: Payer: Self-pay | Admitting: Family Medicine

## 2021-05-28 DIAGNOSIS — Z1231 Encounter for screening mammogram for malignant neoplasm of breast: Secondary | ICD-10-CM

## 2021-05-28 DIAGNOSIS — E2839 Other primary ovarian failure: Secondary | ICD-10-CM

## 2021-05-29 ENCOUNTER — Other Ambulatory Visit: Payer: Self-pay | Admitting: Family Medicine

## 2021-05-29 DIAGNOSIS — R928 Other abnormal and inconclusive findings on diagnostic imaging of breast: Secondary | ICD-10-CM

## 2021-06-20 ENCOUNTER — Ambulatory Visit
Admission: RE | Admit: 2021-06-20 | Discharge: 2021-06-20 | Disposition: A | Discharge status: Home / Self Care | Payer: Medicare Other | Source: Ambulatory Visit | Attending: Family Medicine | Admitting: Family Medicine

## 2021-06-20 DIAGNOSIS — R928 Other abnormal and inconclusive findings on diagnostic imaging of breast: Secondary | ICD-10-CM

## 2022-04-22 DIAGNOSIS — H43813 Vitreous degeneration, bilateral: Secondary | ICD-10-CM | POA: Diagnosis not present

## 2022-04-25 DIAGNOSIS — N390 Urinary tract infection, site not specified: Secondary | ICD-10-CM | POA: Diagnosis not present

## 2022-04-25 DIAGNOSIS — R7301 Impaired fasting glucose: Secondary | ICD-10-CM | POA: Diagnosis not present

## 2022-04-25 DIAGNOSIS — E7800 Pure hypercholesterolemia, unspecified: Secondary | ICD-10-CM | POA: Diagnosis not present

## 2022-04-25 DIAGNOSIS — N952 Postmenopausal atrophic vaginitis: Secondary | ICD-10-CM | POA: Diagnosis not present

## 2022-04-25 DIAGNOSIS — G4733 Obstructive sleep apnea (adult) (pediatric): Secondary | ICD-10-CM | POA: Diagnosis not present

## 2022-04-25 DIAGNOSIS — I10 Essential (primary) hypertension: Secondary | ICD-10-CM | POA: Diagnosis not present

## 2022-04-25 DIAGNOSIS — Z23 Encounter for immunization: Secondary | ICD-10-CM | POA: Diagnosis not present

## 2022-04-25 DIAGNOSIS — Z0000 Encounter for general adult medical examination without abnormal findings: Secondary | ICD-10-CM | POA: Diagnosis not present

## 2022-06-11 ENCOUNTER — Other Ambulatory Visit: Payer: Self-pay | Admitting: Family Medicine

## 2022-06-11 DIAGNOSIS — Z1231 Encounter for screening mammogram for malignant neoplasm of breast: Secondary | ICD-10-CM

## 2022-07-29 ENCOUNTER — Ambulatory Visit
Admission: RE | Admit: 2022-07-29 | Discharge: 2022-07-29 | Disposition: A | Discharge status: Home / Self Care | Payer: Medicare (Managed Care) | Source: Ambulatory Visit | Attending: Family Medicine | Admitting: Family Medicine

## 2022-07-29 DIAGNOSIS — Z1231 Encounter for screening mammogram for malignant neoplasm of breast: Secondary | ICD-10-CM

## 2022-08-26 DIAGNOSIS — N958 Other specified menopausal and perimenopausal disorders: Secondary | ICD-10-CM | POA: Diagnosis not present

## 2022-08-26 DIAGNOSIS — L900 Lichen sclerosus et atrophicus: Secondary | ICD-10-CM | POA: Diagnosis not present

## 2022-08-26 DIAGNOSIS — N950 Postmenopausal bleeding: Secondary | ICD-10-CM | POA: Diagnosis not present

## 2022-08-26 DIAGNOSIS — Z01419 Encounter for gynecological examination (general) (routine) without abnormal findings: Secondary | ICD-10-CM | POA: Diagnosis not present

## 2022-09-09 DIAGNOSIS — N950 Postmenopausal bleeding: Secondary | ICD-10-CM | POA: Diagnosis not present

## 2022-09-09 DIAGNOSIS — N952 Postmenopausal atrophic vaginitis: Secondary | ICD-10-CM | POA: Diagnosis not present

## 2023-01-06 DIAGNOSIS — H6121 Impacted cerumen, right ear: Secondary | ICD-10-CM | POA: Diagnosis not present

## 2023-05-01 ENCOUNTER — Other Ambulatory Visit: Payer: Self-pay | Admitting: Family Medicine

## 2023-05-01 DIAGNOSIS — L900 Lichen sclerosus et atrophicus: Secondary | ICD-10-CM | POA: Diagnosis not present

## 2023-05-01 DIAGNOSIS — E7800 Pure hypercholesterolemia, unspecified: Secondary | ICD-10-CM | POA: Diagnosis not present

## 2023-05-01 DIAGNOSIS — M810 Age-related osteoporosis without current pathological fracture: Secondary | ICD-10-CM | POA: Diagnosis not present

## 2023-05-01 DIAGNOSIS — R059 Cough, unspecified: Secondary | ICD-10-CM | POA: Diagnosis not present

## 2023-05-01 DIAGNOSIS — M1990 Unspecified osteoarthritis, unspecified site: Secondary | ICD-10-CM | POA: Diagnosis not present

## 2023-05-01 DIAGNOSIS — G4733 Obstructive sleep apnea (adult) (pediatric): Secondary | ICD-10-CM | POA: Diagnosis not present

## 2023-05-01 DIAGNOSIS — Z0000 Encounter for general adult medical examination without abnormal findings: Secondary | ICD-10-CM | POA: Diagnosis not present

## 2023-05-01 DIAGNOSIS — I10 Essential (primary) hypertension: Secondary | ICD-10-CM | POA: Diagnosis not present

## 2023-05-01 DIAGNOSIS — R7301 Impaired fasting glucose: Secondary | ICD-10-CM | POA: Diagnosis not present

## 2023-05-01 DIAGNOSIS — E669 Obesity, unspecified: Secondary | ICD-10-CM | POA: Diagnosis not present

## 2023-05-01 DIAGNOSIS — Z23 Encounter for immunization: Secondary | ICD-10-CM | POA: Diagnosis not present

## 2023-08-28 DIAGNOSIS — L900 Lichen sclerosus et atrophicus: Secondary | ICD-10-CM | POA: Diagnosis not present

## 2023-08-28 DIAGNOSIS — N958 Other specified menopausal and perimenopausal disorders: Secondary | ICD-10-CM | POA: Diagnosis not present

## 2023-09-30 DIAGNOSIS — Z0100 Encounter for examination of eyes and vision without abnormal findings: Secondary | ICD-10-CM | POA: Diagnosis not present

## 2023-12-14 ENCOUNTER — Other Ambulatory Visit: Payer: Self-pay | Admitting: Family Medicine

## 2023-12-14 DIAGNOSIS — Z1231 Encounter for screening mammogram for malignant neoplasm of breast: Secondary | ICD-10-CM

## 2024-01-12 ENCOUNTER — Ambulatory Visit
Admission: RE | Admit: 2024-01-12 | Discharge: 2024-01-12 | Disposition: A | Discharge status: Home / Self Care | Payer: Medicare (Managed Care) | Source: Ambulatory Visit | Attending: Family Medicine | Admitting: Family Medicine

## 2024-01-12 DIAGNOSIS — Z1231 Encounter for screening mammogram for malignant neoplasm of breast: Secondary | ICD-10-CM

## 2024-04-04 ENCOUNTER — Other Ambulatory Visit: Payer: Medicare (Managed Care)
